# Patient Record
Sex: Female | Born: 1970 | Race: White | Hispanic: No | Marital: Married | Smoking: Never smoker
Health system: Southern US, Community
[De-identification: ages and names within clinical notes are randomized; demographics above are authoritative.]

## PROBLEM LIST (undated history)

## (undated) DIAGNOSIS — K219 Gastro-esophageal reflux disease without esophagitis: Secondary | ICD-10-CM

## (undated) DIAGNOSIS — D649 Anemia, unspecified: Secondary | ICD-10-CM

## (undated) DIAGNOSIS — M25572 Pain in left ankle and joints of left foot: Secondary | ICD-10-CM

## (undated) DIAGNOSIS — Z0000 Encounter for general adult medical examination without abnormal findings: Secondary | ICD-10-CM

## (undated) DIAGNOSIS — D1720 Benign lipomatous neoplasm of skin and subcutaneous tissue of unspecified limb: Principal | ICD-10-CM

## (undated) DIAGNOSIS — G43909 Migraine, unspecified, not intractable, without status migrainosus: Secondary | ICD-10-CM

## (undated) DIAGNOSIS — E669 Obesity, unspecified: Secondary | ICD-10-CM

## (undated) DIAGNOSIS — T7840XA Allergy, unspecified, initial encounter: Secondary | ICD-10-CM

## (undated) DIAGNOSIS — N6019 Diffuse cystic mastopathy of unspecified breast: Secondary | ICD-10-CM

## (undated) HISTORY — PX: ANTERIOR AND POSTERIOR VAGINAL REPAIR: SUR5

## (undated) HISTORY — DX: Benign lipomatous neoplasm of skin and subcutaneous tissue of unspecified limb: D17.20

## (undated) HISTORY — DX: Encounter for general adult medical examination without abnormal findings: Z00.00

## (undated) HISTORY — PX: WISDOM TOOTH EXTRACTION: SHX21

## (undated) HISTORY — DX: Obesity, unspecified: E66.9

## (undated) HISTORY — DX: Allergy, unspecified, initial encounter: T78.40XA

## (undated) HISTORY — DX: Pain in left ankle and joints of left foot: M25.572

## (undated) HISTORY — DX: Gastro-esophageal reflux disease without esophagitis: K21.9

## (undated) HISTORY — DX: Diffuse cystic mastopathy of unspecified breast: N60.19

## (undated) HISTORY — DX: Anemia, unspecified: D64.9

## (undated) HISTORY — DX: Migraine, unspecified, not intractable, without status migrainosus: G43.909

---
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
## 1998-11-02 ENCOUNTER — Ambulatory Visit (HOSPITAL_COMMUNITY): Admission: RE | Admit: 1998-11-02 | Discharge: 1998-11-02 | Payer: Self-pay | Admitting: Obstetrics and Gynecology

## 1999-02-10 ENCOUNTER — Inpatient Hospital Stay (HOSPITAL_COMMUNITY): Admission: AD | Admit: 1999-02-10 | Discharge: 1999-02-13 | Payer: Self-pay | Admitting: Obstetrics and Gynecology

## 1999-02-15 ENCOUNTER — Encounter (HOSPITAL_COMMUNITY): Admission: RE | Admit: 1999-02-15 | Discharge: 1999-05-16 | Payer: Self-pay | Admitting: Obstetrics and Gynecology

## 1999-03-14 ENCOUNTER — Other Ambulatory Visit: Admission: RE | Admit: 1999-03-14 | Discharge: 1999-03-14 | Payer: Self-pay | Admitting: Obstetrics and Gynecology

## 2000-03-21 ENCOUNTER — Other Ambulatory Visit: Admission: RE | Admit: 2000-03-21 | Discharge: 2000-03-21 | Payer: Self-pay | Admitting: Obstetrics and Gynecology

## 2002-03-03 ENCOUNTER — Other Ambulatory Visit: Admission: RE | Admit: 2002-03-03 | Discharge: 2002-03-03 | Payer: Self-pay | Admitting: Obstetrics and Gynecology

## 2003-04-20 ENCOUNTER — Other Ambulatory Visit: Admission: RE | Admit: 2003-04-20 | Discharge: 2003-04-20 | Payer: Self-pay | Admitting: Obstetrics and Gynecology

## 2004-05-04 ENCOUNTER — Other Ambulatory Visit: Admission: RE | Admit: 2004-05-04 | Discharge: 2004-05-04 | Payer: Self-pay | Admitting: Obstetrics and Gynecology

## 2005-05-15 ENCOUNTER — Other Ambulatory Visit: Admission: RE | Admit: 2005-05-15 | Discharge: 2005-05-15 | Payer: Self-pay | Admitting: Obstetrics and Gynecology

## 2009-06-01 ENCOUNTER — Inpatient Hospital Stay (HOSPITAL_COMMUNITY): Admission: RE | Admit: 2009-06-01 | Discharge: 2009-06-03 | Payer: Self-pay | Admitting: Obstetrics and Gynecology

## 2010-12-08 LAB — CBC
MCHC: 34.2 g/dL (ref 30.0–36.0)
Platelets: 132 10*3/uL — ABNORMAL LOW (ref 150–400)
Platelets: 164 10*3/uL (ref 150–400)
RBC: 3.9 MIL/uL (ref 3.87–5.11)
RDW: 14.9 % (ref 11.5–15.5)
WBC: 9.4 10*3/uL (ref 4.0–10.5)

## 2010-12-08 LAB — RH IMMUNE GLOB WKUP(>/=20WKS)(NOT WOMEN'S HOSP): Fetal Screen: NEGATIVE

## 2010-12-08 LAB — RPR: RPR Ser Ql: NONREACTIVE

## 2011-11-21 ENCOUNTER — Ambulatory Visit (INDEPENDENT_AMBULATORY_CARE_PROVIDER_SITE_OTHER): Payer: BC Managed Care – PPO | Admitting: Family Medicine

## 2011-11-21 ENCOUNTER — Encounter: Payer: Self-pay | Admitting: Family Medicine

## 2011-11-21 VITALS — BP 117/75 | HR 66 | Temp 99.2°F | Ht 64.5 in | Wt 187.0 lb

## 2011-11-21 DIAGNOSIS — Z0000 Encounter for general adult medical examination without abnormal findings: Secondary | ICD-10-CM

## 2011-11-21 DIAGNOSIS — D1779 Benign lipomatous neoplasm of other sites: Secondary | ICD-10-CM

## 2011-11-21 DIAGNOSIS — G43909 Migraine, unspecified, not intractable, without status migrainosus: Secondary | ICD-10-CM

## 2011-11-21 DIAGNOSIS — D1720 Benign lipomatous neoplasm of skin and subcutaneous tissue of unspecified limb: Secondary | ICD-10-CM | POA: Insufficient documentation

## 2011-11-21 DIAGNOSIS — E669 Obesity, unspecified: Secondary | ICD-10-CM

## 2011-11-21 DIAGNOSIS — N6019 Diffuse cystic mastopathy of unspecified breast: Secondary | ICD-10-CM | POA: Insufficient documentation

## 2011-11-21 DIAGNOSIS — K219 Gastro-esophageal reflux disease without esophagitis: Secondary | ICD-10-CM

## 2011-11-21 DIAGNOSIS — IMO0001 Reserved for inherently not codable concepts without codable children: Secondary | ICD-10-CM

## 2011-11-21 HISTORY — DX: Reserved for inherently not codable concepts without codable children: IMO0001

## 2011-11-21 HISTORY — DX: Benign lipomatous neoplasm of skin and subcutaneous tissue of unspecified limb: D17.20

## 2011-11-21 HISTORY — DX: Encounter for general adult medical examination without abnormal findings: Z00.00

## 2011-11-21 HISTORY — DX: Migraine, unspecified, not intractable, without status migrainosus: G43.909

## 2011-11-21 NOTE — Patient Instructions (Signed)
Preventive Care for Adults, Female A healthy lifestyle and preventive care can promote health and wellness. Preventive health guidelines for women include the following key practices.  A routine yearly physical is a good way to check with your caregiver about your health and preventive screening. It is a chance to share any concerns and updates on your health, and to receive a thorough exam.   Visit your dentist for a routine exam and preventive care every 6 months. Brush your teeth twice a day and floss once a day. Good oral hygiene prevents tooth decay and gum disease.   The frequency of eye exams is based on your age, health, family medical history, use of contact lenses, and other factors. Follow your caregiver's recommendations for frequency of eye exams.   Eat a healthy diet. Foods like vegetables, fruits, whole grains, low-fat dairy products, and lean protein foods contain the nutrients you need without too many calories. Decrease your intake of foods high in solid fats, added sugars, and salt. Eat the right amount of calories for you.Get information about a proper diet from your caregiver, if necessary.   Regular physical exercise is one of the most important things you can do for your health. Most adults should get at least 150 minutes of moderate-intensity exercise (any activity that increases your heart rate and causes you to sweat) each week. In addition, most adults need muscle-strengthening exercises on 2 or more days a week.   Maintain a healthy weight. The body mass index (BMI) is a screening tool to identify possible weight problems. It provides an estimate of body fat based on height and weight. Your caregiver can help determine your BMI, and can help you achieve or maintain a healthy weight.For adults 20 years and older:   A BMI below 18.5 is considered underweight.   A BMI of 18.5 to 24.9 is normal.   A BMI of 25 to 29.9 is considered overweight.   A BMI of 30 and above is  considered obese.   Maintain normal blood lipids and cholesterol levels by exercising and minimizing your intake of saturated fat. Eat a balanced diet with plenty of fruit and vegetables. Blood tests for lipids and cholesterol should begin at age 20 and be repeated every 5 years. If your lipid or cholesterol levels are high, you are over 50, or you are at high risk for heart disease, you may need your cholesterol levels checked more frequently.Ongoing high lipid and cholesterol levels should be treated with medicines if diet and exercise are not effective.   If you smoke, find out from your caregiver how to quit. If you do not use tobacco, do not start.   If you are pregnant, do not drink alcohol. If you are breastfeeding, be very cautious about drinking alcohol. If you are not pregnant and choose to drink alcohol, do not exceed 1 drink per day. One drink is considered to be 12 ounces (355 mL) of beer, 5 ounces (148 mL) of wine, or 1.5 ounces (44 mL) of liquor.   Avoid use of street drugs. Do not share needles with anyone. Ask for help if you need support or instructions about stopping the use of drugs.   High blood pressure causes heart disease and increases the risk of stroke. Your blood pressure should be checked at least every 1 to 2 years. Ongoing high blood pressure should be treated with medicines if weight loss and exercise are not effective.   If you are 55 to 42   years old, ask your caregiver if you should take aspirin to prevent strokes.   Diabetes screening involves taking a blood sample to check your fasting blood sugar level. This should be done once every 3 years, after age 45, if you are within normal weight and without risk factors for diabetes. Testing should be considered at a younger age or be carried out more frequently if you are overweight and have at least 1 risk factor for diabetes.   Breast cancer screening is essential preventive care for women. You should practice "breast  self-awareness." This means understanding the normal appearance and feel of your breasts and may include breast self-examination. Any changes detected, no matter how small, should be reported to a caregiver. Women in their 20s and 30s should have a clinical breast exam (CBE) by a caregiver as part of a regular health exam every 1 to 3 years. After age 40, women should have a CBE every year. Starting at age 40, women should consider having a mammography (breast X-ray test) every year. Women who have a family history of breast cancer should talk to their caregiver about genetic screening. Women at a high risk of breast cancer should talk to their caregivers about having magnetic resonance imaging (MRI) and a mammography every year.   The Pap test is a screening test for cervical cancer. A Pap test can show cell changes on the cervix that might become cervical cancer if left untreated. A Pap test is a procedure in which cells are obtained and examined from the lower end of the uterus (cervix).   Women should have a Pap test starting at age 21.   Between ages 21 and 29, Pap tests should be repeated every 2 years.   Beginning at age 30, you should have a Pap test every 3 years as long as the past 3 Pap tests have been normal.   Some women have medical problems that increase the chance of getting cervical cancer. Talk to your caregiver about these problems. It is especially important to talk to your caregiver if a new problem develops soon after your last Pap test. In these cases, your caregiver may recommend more frequent screening and Pap tests.   The above recommendations are the same for women who have or have not gotten the vaccine for human papillomavirus (HPV).   If you had a hysterectomy for a problem that was not cancer or a condition that could lead to cancer, then you no longer need Pap tests. Even if you no longer need a Pap test, a regular exam is a good idea to make sure no other problems are  starting.   If you are between ages 65 and 70, and you have had normal Pap tests going back 10 years, you no longer need Pap tests. Even if you no longer need a Pap test, a regular exam is a good idea to make sure no other problems are starting.   If you have had past treatment for cervical cancer or a condition that could lead to cancer, you need Pap tests and screening for cancer for at least 20 years after your treatment.   If Pap tests have been discontinued, risk factors (such as a new sexual partner) need to be reassessed to determine if screening should be resumed.   The HPV test is an additional test that may be used for cervical cancer screening. The HPV test looks for the virus that can cause the cell changes on the cervix.   The cells collected during the Pap test can be tested for HPV. The HPV test could be used to screen women aged 30 years and older, and should be used in women of any age who have unclear Pap test results. After the age of 30, women should have HPV testing at the same frequency as a Pap test.   Colorectal cancer can be detected and often prevented. Most routine colorectal cancer screening begins at the age of 50 and continues through age 75. However, your caregiver may recommend screening at an earlier age if you have risk factors for colon cancer. On a yearly basis, your caregiver may provide home test kits to check for hidden blood in the stool. Use of a small camera at the end of a tube, to directly examine the colon (sigmoidoscopy or colonoscopy), can detect the earliest forms of colorectal cancer. Talk to your caregiver about this at age 50, when routine screening begins. Direct examination of the colon should be repeated every 5 to 10 years through age 75, unless early forms of pre-cancerous polyps or small growths are found.   Hepatitis C blood testing is recommended for all people born from 1945 through 1965 and any individual with known risks for hepatitis C.    Practice safe sex. Use condoms and avoid high-risk sexual practices to reduce the spread of sexually transmitted infections (STIs). STIs include gonorrhea, chlamydia, syphilis, trichomonas, herpes, HPV, and human immunodeficiency virus (HIV). Herpes, HIV, and HPV are viral illnesses that have no cure. They can result in disability, cancer, and death. Sexually active women aged 25 and younger should be checked for chlamydia. Older women with new or multiple partners should also be tested for chlamydia. Testing for other STIs is recommended if you are sexually active and at increased risk.   Osteoporosis is a disease in which the bones lose minerals and strength with aging. This can result in serious bone fractures. The risk of osteoporosis can be identified using a bone density scan. Women ages 65 and over and women at risk for fractures or osteoporosis should discuss screening with their caregivers. Ask your caregiver whether you should take a calcium supplement or vitamin D to reduce the rate of osteoporosis.   Menopause can be associated with physical symptoms and risks. Hormone replacement therapy is available to decrease symptoms and risks. You should talk to your caregiver about whether hormone replacement therapy is right for you.   Use sunscreen with sun protection factor (SPF) of 30 or more. Apply sunscreen liberally and repeatedly throughout the day. You should seek shade when your shadow is shorter than you. Protect yourself by wearing long sleeves, pants, a wide-brimmed hat, and sunglasses year round, whenever you are outdoors.   Once a month, do a whole body skin exam, using a mirror to look at the skin on your back. Notify your caregiver of new moles, moles that have irregular borders, moles that are larger than a pencil eraser, or moles that have changed in shape or color.   Stay current with required immunizations.   Influenza. You need a dose every fall (or winter). The composition of  the flu vaccine changes each year, so being vaccinated once is not enough.   Pneumococcal polysaccharide. You need 1 to 2 doses if you smoke cigarettes or if you have certain chronic medical conditions. You need 1 dose at age 65 (or older) if you have never been vaccinated.   Tetanus, diphtheria, pertussis (Tdap, Td). Get 1 dose of   Tdap vaccine if you are younger than age 65, are over 65 and have contact with an infant, are a healthcare worker, are pregnant, or simply want to be protected from whooping cough. After that, you need a Td booster dose every 10 years. Consult your caregiver if you have not had at least 3 tetanus and diphtheria-containing shots sometime in your life or have a deep or dirty wound.   HPV. You need this vaccine if you are a woman age 26 or younger. The vaccine is given in 3 doses over 6 months.   Measles, mumps, rubella (MMR). You need at least 1 dose of MMR if you were born in 1957 or later. You may also need a second dose.   Meningococcal. If you are age 19 to 21 and a first-year college student living in a residence hall, or have one of several medical conditions, you need to get vaccinated against meningococcal disease. You may also need additional booster doses.   Zoster (shingles). If you are age 60 or older, you should get this vaccine.   Varicella (chickenpox). If you have never had chickenpox or you were vaccinated but received only 1 dose, talk to your caregiver to find out if you need this vaccine.   Hepatitis A. You need this vaccine if you have a specific risk factor for hepatitis A virus infection or you simply wish to be protected from this disease. The vaccine is usually given as 2 doses, 6 to 18 months apart.   Hepatitis B. You need this vaccine if you have a specific risk factor for hepatitis B virus infection or you simply wish to be protected from this disease. The vaccine is given in 3 doses, usually over 6 months.  Preventive Services /  Frequency Ages 19 to 39  Blood pressure check.** / Every 1 to 2 years.   Lipid and cholesterol check.** / Every 5 years beginning at age 20.   Clinical breast exam.** / Every 3 years for women in their 20s and 30s.   Pap test.** / Every 2 years from ages 21 through 29. Every 3 years starting at age 30 through age 65 or 70 with a history of 3 consecutive normal Pap tests.   HPV screening.** / Every 3 years from ages 30 through ages 65 to 70 with a history of 3 consecutive normal Pap tests.   Hepatitis C blood test.** / For any individual with known risks for hepatitis C.   Skin self-exam. / Monthly.   Influenza immunization.** / Every year.   Pneumococcal polysaccharide immunization.** / 1 to 2 doses if you smoke cigarettes or if you have certain chronic medical conditions.   Tetanus, diphtheria, pertussis (Tdap, Td) immunization. / A one-time dose of Tdap vaccine. After that, you need a Td booster dose every 10 years.   HPV immunization. / 3 doses over 6 months, if you are 26 and younger.   Measles, mumps, rubella (MMR) immunization. / You need at least 1 dose of MMR if you were born in 1957 or later. You may also need a second dose.   Meningococcal immunization. / 1 dose if you are age 19 to 21 and a first-year college student living in a residence hall, or have one of several medical conditions, you need to get vaccinated against meningococcal disease. You may also need additional booster doses.   Varicella immunization.** / Consult your caregiver.   Hepatitis A immunization.** / Consult your caregiver. 2 doses, 6 to 18 months   apart.   Hepatitis B immunization.** / Consult your caregiver. 3 doses usually over 6 months.  Ages 40 to 64  Blood pressure check.** / Every 1 to 2 years.   Lipid and cholesterol check.** / Every 5 years beginning at age 20.   Clinical breast exam.** / Every year after age 40.   Mammogram.** / Every year beginning at age 40 and continuing for as  long as you are in good health. Consult with your caregiver.   Pap test.** / Every 3 years starting at age 30 through age 65 or 70 with a history of 3 consecutive normal Pap tests.   HPV screening.** / Every 3 years from ages 30 through ages 65 to 70 with a history of 3 consecutive normal Pap tests.   Fecal occult blood test (FOBT) of stool. / Every year beginning at age 50 and continuing until age 75. You may not need to do this test if you get a colonoscopy every 10 years.   Flexible sigmoidoscopy or colonoscopy.** / Every 5 years for a flexible sigmoidoscopy or every 10 years for a colonoscopy beginning at age 50 and continuing until age 75.   Hepatitis C blood test.** / For all people born from 1945 through 1965 and any individual with known risks for hepatitis C.   Skin self-exam. / Monthly.   Influenza immunization.** / Every year.   Pneumococcal polysaccharide immunization.** / 1 to 2 doses if you smoke cigarettes or if you have certain chronic medical conditions.   Tetanus, diphtheria, pertussis (Tdap, Td) immunization.** / A one-time dose of Tdap vaccine. After that, you need a Td booster dose every 10 years.   Measles, mumps, rubella (MMR) immunization. / You need at least 1 dose of MMR if you were born in 1957 or later. You may also need a second dose.   Varicella immunization.** / Consult your caregiver.   Meningococcal immunization.** / Consult your caregiver.   Hepatitis A immunization.** / Consult your caregiver. 2 doses, 6 to 18 months apart.   Hepatitis B immunization.** / Consult your caregiver. 3 doses, usually over 6 months.  Ages 65 and over  Blood pressure check.** / Every 1 to 2 years.   Lipid and cholesterol check.** / Every 5 years beginning at age 20.   Clinical breast exam.** / Every year after age 40.   Mammogram.** / Every year beginning at age 40 and continuing for as long as you are in good health. Consult with your caregiver.   Pap test.** /  Every 3 years starting at age 30 through age 65 or 70 with a 3 consecutive normal Pap tests. Testing can be stopped between 65 and 70 with 3 consecutive normal Pap tests and no abnormal Pap or HPV tests in the past 10 years.   HPV screening.** / Every 3 years from ages 30 through ages 65 or 70 with a history of 3 consecutive normal Pap tests. Testing can be stopped between 65 and 70 with 3 consecutive normal Pap tests and no abnormal Pap or HPV tests in the past 10 years.   Fecal occult blood test (FOBT) of stool. / Every year beginning at age 50 and continuing until age 75. You may not need to do this test if you get a colonoscopy every 10 years.   Flexible sigmoidoscopy or colonoscopy.** / Every 5 years for a flexible sigmoidoscopy or every 10 years for a colonoscopy beginning at age 50 and continuing until age 75.   Hepatitis   C blood test.** / For all people born from 1945 through 1965 and any individual with known risks for hepatitis C.   Osteoporosis screening.** / A one-time screening for women ages 65 and over and women at risk for fractures or osteoporosis.   Skin self-exam. / Monthly.   Influenza immunization.** / Every year.   Pneumococcal polysaccharide immunization.** / 1 dose at age 65 (or older) if you have never been vaccinated.   Tetanus, diphtheria, pertussis (Tdap, Td) immunization. / A one-time dose of Tdap vaccine if you are over 65 and have contact with an infant, are a healthcare worker, or simply want to be protected from whooping cough. After that, you need a Td booster dose every 10 years.   Varicella immunization.** / Consult your caregiver.   Meningococcal immunization.** / Consult your caregiver.   Hepatitis A immunization.** / Consult your caregiver. 2 doses, 6 to 18 months apart.   Hepatitis B immunization.** / Check with your caregiver. 3 doses, usually over 6 months.  ** Family history and personal history of risk and conditions may change your caregiver's  recommendations. Document Released: 10/16/2001 Document Revised: 08/09/2011 Document Reviewed: 01/15/2011 [employer] Patient Information 2012 [employer]. 

## 2011-11-21 NOTE — Assessment & Plan Note (Signed)
Sees her gyn for paps and labs historically, we will request old records and repeat her labs prior to next years check up

## 2011-11-21 NOTE — Assessment & Plan Note (Signed)
Given a copy of the DASH diet to consider and encouraged to try and exercise more

## 2011-11-21 NOTE — Assessment & Plan Note (Signed)
About 1 cm lesion, firm about mid thigh has been present for several years but the thigh distal to that is increasingly uncomfortable for the past 1-2 months, will check ultrasound and she will report if symptoms worsen

## 2011-11-21 NOTE — Assessment & Plan Note (Signed)
Infrequent and responsive to Zomig and Ibuprofen, may use them together as needed.

## 2011-11-21 NOTE — Progress Notes (Signed)
Patient ID: Kristen Evans, female   DOB: Jan 28, 1971, 41 y.o.   MRN: [id number] Kristen Evans [id number] 1971-06-16 11/21/2011      Progress Note New Patient  Subjective  Chief Complaint  Chief Complaint  Patient presents with  . Establish Care    left thigh pain, known cyst    HPI  Patient is a 41 year old Caucasian female who is in today for new patient appointment. She is generally in good health. She's concerned about a lesion on her left anterior thigh which is actually present for about 2 years. Her concerns over the last several weeks has been a mild achy sensation distal to that lesion. The lesion does not appear to growing. She denies any trauma. It is firm and nontender itself. She sees OB/GYN for her GYN care is. She has a migraines but they're infrequent. She has reflux less than once a week and does take an occasional ranitidine with good effect. No recent illness, fevers, chills, congestion, chest pain, palpitations, shortness of breath, GI or GU complaints.  Past Medical History  Diagnosis Date  . Migraine   . Obesity   . Anemia     with pregnancy  . GERD (gastroesophageal reflux disease)     pregnancy and recently  . Fibrocystic breast disease   . Lipoma of lower extremity 11/21/2011  . Reflux 11/21/2011  . Migraines 11/21/2011  . Preventative health care 11/21/2011    Past Surgical History  Procedure Date  . Breast surgery 1987    biopsy    Family History  Problem Relation Age of Onset  . Cancer Mother     breast  . Hypertension Mother   . Hyperlipidemia Father   . Other Father     polycythemia  vera  . Arthritis Maternal Grandmother   . Cancer Maternal Grandmother     breast  . Hyperlipidemia Maternal Grandmother   . Stroke Maternal Grandmother   . Heart disease Maternal Grandfather   . Hyperlipidemia Maternal Grandfather   . Stroke Maternal Grandfather   . Heart disease Paternal Grandfather   . GER disease Daughter     History   Social  History  . Marital Status: Married    Spouse Name: N/A    Number of Children: N/A  . Years of Education: N/A   Occupational History  . Not on file.   Social History Main Topics  . Smoking status: Never Smoker   . Smokeless tobacco: Never Used  . Alcohol Use: No  . Drug Use: No  . Sexually Active: Yes   Other Topics Concern  . Not on file   Social History Narrative  . No narrative on file    No current outpatient prescriptions on file prior to visit.    Allergies  Allergen Reactions  . Penicillins Hives    Review of Systems  Review of Systems  Constitutional: Negative for fever and malaise/fatigue.  HENT: Negative for congestion.   Eyes: Negative for discharge.  Respiratory: Negative for shortness of breath.   Cardiovascular: Negative for chest pain, palpitations and leg swelling.  Gastrointestinal: Positive for heartburn. Negative for nausea, abdominal pain and diarrhea.  Genitourinary: Negative for dysuria.  Musculoskeletal: Positive for myalgias. Negative for falls.        Thigh, left, nodule and pain  Skin: Negative for rash.  Neurological: Negative for loss of consciousness and headaches.  Endo/Heme/Allergies: Negative for polydipsia.  Psychiatric/Behavioral: Negative for depression and suicidal ideas. The patient is not nervous/anxious and does  not have insomnia.     Objective  BP 117/75  Pulse 66  Temp(Src) 99.2 F (37.3 C) (Temporal)  Ht 5' 4.5" (1.638 m)  Wt 187 lb (84.823 kg)  BMI 31.60 kg/m2  SpO2 97%  LMP 11/03/2011  Physical Exam  Physical Exam  Constitutional: She is oriented to person, place, and time and well-developed, well-nourished, and in no distress. No distress.  HENT:  Head: Normocephalic and atraumatic.  Right Ear: External ear normal.  Left Ear: External ear normal.  Nose: Nose normal.  Mouth/Throat: Oropharynx is clear and moist. No oropharyngeal exudate.  Eyes: Conjunctivae are normal. Pupils are equal, round, and  reactive to light. Right eye exhibits no discharge. Left eye exhibits no discharge. No scleral icterus.  Neck: Normal range of motion. Neck supple. No thyromegaly present.  Cardiovascular: Normal rate, regular rhythm, normal heart sounds and intact distal pulses.   No murmur heard. Pulmonary/Chest: Effort normal and breath sounds normal. No respiratory distress. She has no wheezes. She has no rales.  Abdominal: Soft. Bowel sounds are normal. She exhibits no distension and no mass. There is no tenderness.  Musculoskeletal: Normal range of motion. She exhibits no edema and no tenderness.  Lymphadenopathy:    She has no cervical adenopathy.  Neurological: She is alert and oriented to person, place, and time. She has normal reflexes. No cranial nerve deficit. Coordination normal.  Skin: Skin is warm and dry. No rash noted. She is not diaphoretic.       1 cm firm round nodule mid anterior left thigh  Psychiatric: Mood, memory and affect normal.       Assessment & Plan  Preventative health care Sees her gyn for paps and labs historically, we will request old records and repeat her labs prior to next years check up  Reflux infrequent episodes, encouraged to avoid offending foods and may use ranitidine prn, notify us if symptoms worsen  Migraines Infrequent and responsive to Zomig and Ibuprofen, may use them together as needed.  Lipoma of lower extremity About 1 cm lesion, firm about mid thigh has been present for several years but the thigh distal to that is increasingly uncomfortable for the past 1-2 months, will check ultrasound and she will report if symptoms worsen  Obesity Given a copy of the DASH diet to consider and encouraged to try and exercise more

## 2011-11-21 NOTE — Assessment & Plan Note (Signed)
infrequent episodes, encouraged to avoid offending foods and may use ranitidine prn, notify us if symptoms worsen

## 2011-11-22 ENCOUNTER — Ambulatory Visit (HOSPITAL_BASED_OUTPATIENT_CLINIC_OR_DEPARTMENT_OTHER)
Admission: RE | Admit: 2011-11-22 | Discharge: 2011-11-22 | Disposition: A | Discharge status: Home / Self Care | Payer: BC Managed Care – PPO | Source: Ambulatory Visit | Attending: Family Medicine | Admitting: Family Medicine

## 2011-11-22 ENCOUNTER — Other Ambulatory Visit (HOSPITAL_BASED_OUTPATIENT_CLINIC_OR_DEPARTMENT_OTHER): Payer: Self-pay | Admitting: Family Medicine

## 2011-11-22 ENCOUNTER — Other Ambulatory Visit: Payer: Self-pay | Admitting: Family Medicine

## 2011-11-22 DIAGNOSIS — R229 Localized swelling, mass and lump, unspecified: Secondary | ICD-10-CM | POA: Insufficient documentation

## 2011-11-22 DIAGNOSIS — D1720 Benign lipomatous neoplasm of skin and subcutaneous tissue of unspecified limb: Secondary | ICD-10-CM

## 2011-11-22 DIAGNOSIS — R29898 Other symptoms and signs involving the musculoskeletal system: Secondary | ICD-10-CM

## 2011-11-22 DIAGNOSIS — R9389 Abnormal findings on diagnostic imaging of other specified body structures: Secondary | ICD-10-CM

## 2011-11-27 ENCOUNTER — Other Ambulatory Visit: Payer: Self-pay | Admitting: Family Medicine

## 2011-11-27 DIAGNOSIS — R2240 Localized swelling, mass and lump, unspecified lower limb: Secondary | ICD-10-CM

## 2011-11-29 ENCOUNTER — Telehealth: Payer: Self-pay

## 2011-11-29 NOTE — Telephone Encounter (Signed)
Patient left a message stating she would like a call back from MD. Pt would like to ask a couple of questions about the ultrasound results? Pt did state in the message that a referral to the surgeon has already been done and she has an appt. Please advise?

## 2011-12-03 NOTE — Telephone Encounter (Signed)
Spoke with patient by phone and let her know that her ultrasound results showed multiple nonspecific lesions, tissue is needed to rule out more serious symptoms such as cancer or lupus

## 2011-12-17 ENCOUNTER — Encounter (INDEPENDENT_AMBULATORY_CARE_PROVIDER_SITE_OTHER): Payer: Self-pay | Admitting: General Surgery

## 2011-12-17 ENCOUNTER — Other Ambulatory Visit (INDEPENDENT_AMBULATORY_CARE_PROVIDER_SITE_OTHER): Payer: Self-pay | Admitting: General Surgery

## 2011-12-17 ENCOUNTER — Ambulatory Visit (INDEPENDENT_AMBULATORY_CARE_PROVIDER_SITE_OTHER): Payer: BC Managed Care – PPO | Admitting: General Surgery

## 2011-12-17 VITALS — BP 118/80 | HR 70 | Temp 97.8°F | Resp 18 | Ht 64.5 in | Wt 184.4 lb

## 2011-12-17 DIAGNOSIS — R229 Localized swelling, mass and lump, unspecified: Secondary | ICD-10-CM

## 2011-12-17 DIAGNOSIS — R2240 Localized swelling, mass and lump, unspecified lower limb: Secondary | ICD-10-CM

## 2011-12-17 NOTE — Assessment & Plan Note (Signed)
Given ultrasound findings of multiplicity, would get MR to better characterize lesions.    At that point, if still concerning, would get needle biopsy.  If lesions are malignant, would determine whether I do en bloc resection or shell out mass(es).  Either way, pt is symptomatic, and will likely need resection.  Follow up after MR results and biopsy.

## 2011-12-17 NOTE — Progress Notes (Signed)
Chief Complaint  Patient presents with  . Mass    Leg    HISTORY: The patient is a 41 year old female who has had a 2 cm nodule in her upper thigh for many years. This has never bothered her. However, in the fall she started exercising. She noticed that her distal medial left thigh was hurting when she exercises. She felt like there was a lumpiness to this. She brought this up to Dr. Abner Greenspan who ordered an ultrasound. The ultrasound demonstrated multiple small nodules in this area. The concern was brought up regarding metastatic disease since there were more than one. She has had a skin lesion taken off near this area around 5-10 years ago. She was told this was benign. She has no history of malignancy or cancer. She does have a family history of breast cancer in her mother and grandmother. Her thigh continues to hurt when she exercises. When she rubs it or when she rests, it feels better.  Past Medical History  Diagnosis Date  . Migraine   . Obesity   . Anemia     with pregnancy  . GERD (gastroesophageal reflux disease)     pregnancy and recently  . Fibrocystic breast disease   . Lipoma of lower extremity 11/21/2011  . Reflux 11/21/2011  . Migraines 11/21/2011  . Preventative health care 11/21/2011    Past Surgical History  Procedure Date  . Breast surgery 1987    biopsy    Current Outpatient Prescriptions  Medication Sig Dispense Refill  . Aspirin-Acetaminophen-Caffeine (EXCEDRIN MIGRAINE PO) Take by mouth as needed.      . ranitidine (ZANTAC) 75 MG tablet Take 75 mg by mouth as needed.      . zolmitriptan (ZOMIG) 5 MG tablet Take 5 mg by mouth as needed.         Allergies  Allergen Reactions  . Penicillins Hives    Happened in childhood, may have spread all over the body.     Family History  Problem Relation Age of Onset  . Cancer Mother     breast  . Hypertension Mother   . Hyperlipidemia Father   . Other Father     polycythemia  vera  . Arthritis Maternal  Grandmother   . Cancer Maternal Grandmother     breast  . Hyperlipidemia Maternal Grandmother   . Stroke Maternal Grandmother   . Heart disease Maternal Grandfather   . Hyperlipidemia Maternal Grandfather   . Stroke Maternal Grandfather   . Heart disease Paternal Grandfather   . GER disease Daughter      History   Social History  . Marital Status: Married    Spouse Name: N/A    Number of Children: N/A  . Years of Education: N/A   Social History Main Topics  . Smoking status: Never Smoker   . Smokeless tobacco: Never Used  . Alcohol Use: No  . Drug Use: No  . Sexually Active: Yes   Other Topics Concern  . None   Social History Narrative  . None     REVIEW OF SYSTEMS - PERTINENT POSITIVES ONLY: 12 point review of systems negative other than HPI and PMH.  EXAM: Filed Vitals:   12/17/11 1456  BP: 118/80  Pulse: 70  Temp: 97.8 F (36.6 C)  Resp: 18    Gen:  No acute distress.  Well nourished and well groomed.   Neurological: Alert and oriented to person, place, and time. Coordination normal.  Head: Normocephalic and atraumatic.  Eyes: Conjunctivae are normal. Pupils are equal, round, and reactive to light. No scleral icterus.  Neck: Normal range of motion. Neck supple. No tracheal deviation or thyromegaly present.  Cardiovascular: Normal rate, regular rhythm, normal heart sounds and intact distal pulses.  Exam reveals no gallop and no friction rub.  No murmur heard. Respiratory: Effort normal.  No respiratory distress. No chest wall tenderness. Breath sounds normal.  No wheezes, rales or rhonchi.  GI: Soft. Bowel sounds are normal. The abdomen is soft and nontender.  There is no rebound and no guarding.  Musculoskeletal: Normal range of motion. Extremities are nontender.  Left thigh with 1.5 -2 cm mass in upper thigh (this is one that is unchanged).  Lower medial thigh near knee with 1 cm nodule palpable when pt extends knee.   Lymphadenopathy: No cervical,  preauricular, postauricular or axillary adenopathy is present Skin: Skin is warm and dry. No rash noted. No diaphoresis. No erythema. No pallor. No clubbing, cyanosis, or edema.   Psychiatric: Normal mood and affect. Behavior is normal. Judgment and thought content normal.    LABORATORY RESULTS: None recently.   RADIOLOGY RESULTS: L thigh ultrasound IMPRESSION:  Multiple hyperechoic well-defined hypovascular masses in the  subcutaneous fat of the left thigh. The appearance is nonspecific.  Given the multiplicity, a systemic abnormality or metastatic  disease must be considered. I recommend consideration of biopsy  one of the lesions for diagnosis.     ASSESSMENT AND PLAN: left thigh masses Given ultrasound findings of multiplicity, would get MR to better characterize lesions.    At that point, if still concerning, would get needle biopsy.  If lesions are malignant, would determine whether I do en bloc resection or shell out mass(es).  Either way, pt is symptomatic, and will likely need resection.  Follow up after MR results and biopsy.    Maudry Diego MD Surgical Oncology, General and Endocrine Surgery [facility]    Visit Diagnoses: No diagnosis found.  Primary Care Physician: Danise Edge, MD, MD

## 2011-12-25 ENCOUNTER — Ambulatory Visit (HOSPITAL_COMMUNITY)
Admission: RE | Admit: 2011-12-25 | Discharge: 2011-12-25 | Disposition: A | Discharge status: Home / Self Care | Payer: BC Managed Care – PPO | Source: Ambulatory Visit | Attending: General Surgery | Admitting: General Surgery

## 2011-12-25 DIAGNOSIS — R229 Localized swelling, mass and lump, unspecified: Secondary | ICD-10-CM | POA: Insufficient documentation

## 2011-12-25 DIAGNOSIS — R2240 Localized swelling, mass and lump, unspecified lower limb: Secondary | ICD-10-CM

## 2011-12-25 MED ORDER — GADOBENATE DIMEGLUMINE 529 MG/ML IV SOLN
20.0000 mL | Freq: Once | INTRAVENOUS | Status: AC | PRN
  Administered 2011-12-25: 18 mL via INTRAVENOUS

## 2012-01-01 ENCOUNTER — Telehealth (INDEPENDENT_AMBULATORY_CARE_PROVIDER_SITE_OTHER): Payer: Self-pay | Admitting: General Surgery

## 2012-01-01 NOTE — Telephone Encounter (Signed)
Called pt and gave her MRI results.  Kristen Evans wanted to know if Dr. Donell Beers felt the need for any biopsies of these areas.  I told her I would find out and call her back.

## 2012-01-02 NOTE — Telephone Encounter (Signed)
Muscle looks good, no evidence of muscle mass.  Look like lipomas.  Don't have to get biopsy, but can remove painful ones if she would like.

## 2014-06-02 ENCOUNTER — Encounter: Payer: Self-pay | Admitting: Physician Assistant

## 2014-06-02 ENCOUNTER — Ambulatory Visit (INDEPENDENT_AMBULATORY_CARE_PROVIDER_SITE_OTHER): Payer: BC Managed Care – PPO | Admitting: Physician Assistant

## 2014-06-02 VITALS — BP 128/69 | HR 77 | Temp 98.3°F | Resp 16 | Ht 64.5 in | Wt 184.0 lb

## 2014-06-02 DIAGNOSIS — M25569 Pain in unspecified knee: Secondary | ICD-10-CM

## 2014-06-02 DIAGNOSIS — M222X1 Patellofemoral disorders, right knee: Secondary | ICD-10-CM

## 2014-06-02 DIAGNOSIS — M222X9 Patellofemoral disorders, unspecified knee: Secondary | ICD-10-CM | POA: Insufficient documentation

## 2014-06-02 MED ORDER — MELOXICAM 15 MG PO TABS: 15.0000 mg | ORAL_TABLET | Freq: Every day | ORAL | Status: DC

## 2014-06-02 NOTE — Progress Notes (Signed)
Pre visit review using our clinic review tool, if applicable. No additional management support is needed unless otherwise documented below in the visit note/SLS  

## 2014-06-02 NOTE — Progress Notes (Signed)
Patient presents to clinic today c/o pain in left knee since last Monday. Endorses some mild soreness initially until around Thursday when the symptoms worsened after going jogging.  Denies numbness or tingling.  Denies swelling or decreased ROM.  Denies trauma or injury.  Has not taken anything for symptoms.  Past Medical History  Diagnosis Date  . Migraine   . Obesity   . Anemia     with pregnancy  . GERD (gastroesophageal reflux disease)     pregnancy and recently  . Fibrocystic breast disease   . Lipoma of lower extremity 11/21/2011  . Reflux 11/21/2011  . Migraines 11/21/2011  . Preventative health care 11/21/2011    Current Outpatient Prescriptions on File Prior to Visit  Medication Sig Dispense Refill  . Aspirin-Acetaminophen-Caffeine (EXCEDRIN MIGRAINE PO) Take by mouth as needed.      . ranitidine (ZANTAC) 75 MG tablet Take 75 mg by mouth as needed.      . zolmitriptan (ZOMIG) 5 MG tablet Take 5 mg by mouth as needed.       No current facility-administered medications on file prior to visit.    Allergies  Allergen Reactions  . Penicillins Hives    Happened in childhood, may have spread all over the body.    Family History  Problem Relation Age of Onset  . Cancer Mother     breast  . Hypertension Mother   . Hyperlipidemia Father   . Other Father     polycythemia  vera  . Arthritis Maternal Grandmother   . Cancer Maternal Grandmother     breast  . Hyperlipidemia Maternal Grandmother   . Stroke Maternal Grandmother   . Heart disease Maternal Grandfather   . Hyperlipidemia Maternal Grandfather   . Stroke Maternal Grandfather   . Heart disease Paternal Grandfather   . GER disease Daughter     History   Social History  . Marital Status: Married    Spouse Name: N/A    Number of Children: N/A  . Years of Education: N/A   Social History Main Topics  . Smoking status: Never Smoker   . Smokeless tobacco: Never Used  . Alcohol Use: No  . Drug Use: No  .  Sexual Activity: Yes   Other Topics Concern  . None   Social History Narrative  . None   Review of Systems - See HPI.  All other ROS are negative.  BP 128/69  Pulse 77  Temp(Src) 98.3 F (36.8 C) (Oral)  Resp 16  Ht 5' 4.5" (1.638 m)  Wt 184 lb (83.462 kg)  BMI 31.11 kg/m2  SpO2 100%  LMP 05/19/2014  Physical Exam  Vitals reviewed. Constitutional: She is oriented to person, place, and time and well-developed, well-nourished, and in no distress.  HENT:  Head: Normocephalic and atraumatic.  Cardiovascular: Normal rate, regular rhythm, normal heart sounds and intact distal pulses.   Pulmonary/Chest: Effort normal and breath sounds normal.  Musculoskeletal:       Right knee: She exhibits normal range of motion, no swelling, normal alignment, no LCL laxity, normal patellar mobility, normal meniscus and no MCL laxity. Tenderness found.       Left knee: Normal.  Neurological: She is alert and oriented to person, place, and time.  Skin: Skin is warm and dry. No rash noted.   Assessment/Plan: Patellofemoral pain syndrome Rx Mobic daily. Encouraged Knee sling with open patella.  Wear daily. Rest, Ice and Elevation. Follow-up if symptoms are not improving.

## 2014-06-02 NOTE — Assessment & Plan Note (Signed)
Rx Mobic daily. Encouraged Knee sling with open patella.  Wear daily. Rest, Ice and Elevation. Follow-up if symptoms are not improving.

## 2014-06-02 NOTE — Patient Instructions (Signed)
Please wear knee brace during the day.  Take mobic once daily.  Use tylenol for breakthrough pain.  Rest.  Elevate your leg while resting.  Avoid overexertion.  Call or return to clinic if symptoms are not improving over the next week.  Patellofemoral Syndrome If you have had pain in the front of your knee for a long time, chances are good that you have patellofemoral syndrome. The word patella refers to the kneecap. Femoral (or femur) refers to the thigh bone. That is the bone the kneecap sits on. The kneecap is shaped like a triangle. Its job is to protect the knee and to improve the efficiency of your thigh muscles (quadriceps). The underside of the kneecap is made of smooth tissue (cartilage). This lets the kneecap slide up and down as the knee moves. Sometimes this cartilage becomes soft. Your healthcare provider may say the cartilage breaks down. That is patellofemoral syndrome. It can affect one knee, or both. The condition is sometimes called patellofemoral pain syndrome. That is because the condition is painful. The pain usually gets worse with activity. Sitting for a long time with the knee bent also makes the pain worse. It usually gets better with rest and proper treatment. CAUSES  No one is sure why some people develop this problem and others do not. Runners often get it. One name for the condition is "runner's knee." However, some people run for years and never have knee pain. Certain things seem to make patellofemoral syndrome more likely. They include:  Moving out of alignment. The kneecap is supposed to move in a straight line when the thigh muscle pulls on it. Sometimes the kneecap moves in poor alignment. That can make the knee swell and hurt. Some experts believe it also wears down the cartilage.  Injury to the kneecap.  Strain on the knee. This may occur during sports activity. Soccer, running, skiing and cycling can put excess stress on the knee.  Being flat-footed or  knock-kneed. SYMPTOMS   Knee pain.  Pain under the kneecap. This is usually a dull, aching pain.  Pain in the knee when doing certain things: squatting, kneeling, going up or down stairs.  Pain in the knee when you stand up after sitting down for awhile.  Tightness in the knee.  Loss of muscle strength in the thigh.  Swelling of the knee. DIAGNOSIS  Healthcare providers often send people with knee pain to an orthopedic caregiver. This person has special training to treat problems with bones and joints. To decide what is causing your knee pain, your caregiver will probably:  Do a physical exam. This will probably include:  Asking about symptoms you have noticed.  Asking about your activities and any injuries.  Feeling your knee. Moving it. This will help test the knee's strength. It will also check alignment (whether the knee and leg are aligned normally).  Order some tests, such as:  Imaging tests. They create pictures of the inside of the knee. Tests may include:  X-rays.  Computed tomography (CT) scan. This uses X-rays and a computer to show more detail.  Magnetic resonance imaging (MRI). This test uses magnets, radio waves and a computer to make pictures. TREATMENT   Medication is almost always used first. It can relieve pain. It also can reduce swelling. Non-steroidal anti-inflammatory medicines (called NSAIDs) are usually suggested. Sometimes a stronger form is needed. A stronger form would require a prescription.  Other treatment may be needed after the swelling goes down. Possibilities include:  Exercise. Certain exercises can make the muscles around the knee stronger which decreases the pressure on the knee cap. This includes the thigh muscle. Certain exercises also may be suggested to increase your flexibility.  A knee brace. This gives the knee extra support and helps align the movement of the knee cap.  Orthotics. These are special shoe inserts. They can help  keep your leg and knee aligned.  Surgery is sometimes needed. This is rare. Options include:  Arthroscopy. The surgeon uses a special tool to remove any damaged pieces of the kneecap. Only a few small incisions (cuts) are needed.  Realignment. This is open surgery. The goals are to reduce pressure and fix the way the kneecap moves. HOME CARE INSTRUCTIONS   Take any medication prescribed by your healthcare provider. Follow the directions carefully.  If your knee is swollen:  Put ice or cold packs on it. Do this for 20 to 30 minutes, 3 to 4 times a day.  Keep the knee raised. Make sure it is supported. Put a pillow under it.  Rest your knee. For example, take the elevator instead of the stairs for awhile. Or, take a break from sports activity that strain your knee. Try walking or swimming instead.  Whenever you are active:  Use an elastic bandage on your knee. This gives it support.  After any activity, put ice or cold packs on your knees. Do this for about 10 to 20 minutes.  Make sure you wear shoes that give good support. Make sure they are not worn down. The heels should not slant in or out. SEEK MEDICAL CARE IF:   Knee pain gets worse. Or it does not go away, even after taking pain medicine.  Swelling does not go down.  Your thigh muscle becomes weak.  You have an oral temperature above 102 F (38.9 C). SEEK IMMEDIATE MEDICAL CARE IF:  You have an oral temperature above 102 F (38.9 C), not controlled by medicine. Document Released: 08/08/2009 Document Revised: 11/12/2011 Document Reviewed: 11/09/2013 [facility] Patient Information 2015 [place], [place]. This information is not intended to replace advice given to you by your health care provider. Make sure you discuss any questions you have with your health care provider.

## 2015-04-14 ENCOUNTER — Other Ambulatory Visit: Payer: Self-pay

## 2015-04-14 DIAGNOSIS — Z1231 Encounter for screening mammogram for malignant neoplasm of breast: Secondary | ICD-10-CM

## 2015-05-27 ENCOUNTER — Ambulatory Visit
Admission: RE | Admit: 2015-05-27 | Discharge: 2015-05-27 | Disposition: A | Discharge status: Home / Self Care | Payer: BLUE CROSS/BLUE SHIELD | Source: Ambulatory Visit

## 2015-05-27 DIAGNOSIS — Z1231 Encounter for screening mammogram for malignant neoplasm of breast: Secondary | ICD-10-CM

## 2016-06-20 DIAGNOSIS — G43909 Migraine, unspecified, not intractable, without status migrainosus: Secondary | ICD-10-CM | POA: Insufficient documentation

## 2017-03-04 ENCOUNTER — Ambulatory Visit (INDEPENDENT_AMBULATORY_CARE_PROVIDER_SITE_OTHER): Payer: Self-pay | Admitting: Family Medicine

## 2017-03-04 ENCOUNTER — Encounter: Payer: Self-pay | Admitting: Family Medicine

## 2017-03-04 DIAGNOSIS — R2240 Localized swelling, mass and lump, unspecified lower limb: Secondary | ICD-10-CM

## 2017-03-04 DIAGNOSIS — M25572 Pain in left ankle and joints of left foot: Secondary | ICD-10-CM

## 2017-03-04 DIAGNOSIS — R233 Spontaneous ecchymoses: Secondary | ICD-10-CM | POA: Insufficient documentation

## 2017-03-04 DIAGNOSIS — G8929 Other chronic pain: Secondary | ICD-10-CM

## 2017-03-04 DIAGNOSIS — R238 Other skin changes: Secondary | ICD-10-CM

## 2017-03-04 HISTORY — DX: Pain in left ankle and joints of left foot: M25.572

## 2017-03-04 NOTE — Assessment & Plan Note (Signed)
Lipoma still present nontender. She feels it may be slightly larger but no warmth or pain. She will report changes and let us know if she is interested in pursuing a work up.

## 2017-03-04 NOTE — Assessment & Plan Note (Signed)
With some swelling, elevate feet above heart for 25 minutes 3 x a day, encouraged to elevate and wrap can use topical treatment for the pain such as Lidocaine. Encouraged daily exercises. Report worsening

## 2017-03-04 NOTE — Patient Instructions (Signed)
merrel's and clark's sandel Ankle Pain Many things can cause ankle pain, including an injury to the area and overuse of the ankle.The ankle joint holds your body weight and allows you to move around. Ankle pain can occur on either side or the back of one ankle or both ankles. Ankle pain may be sharp and burning or dull and aching. There may be tenderness, stiffness, redness, or warmth around the ankle. Follow these instructions at home: Activity  Rest your ankle as told by your health care provider. Avoid any activities that cause ankle pain.  Do exercises as told by your health care provider.  Ask your health care provider if you can drive. Using a brace, a bandage, or crutches  If you were given a brace: ? Wear it as told by your health care provider. ? Remove it when you take a bath or a shower. ? Try not to move your ankle very much, but wiggle your toes from time to time. This helps to prevent swelling.  If you were given an elastic bandage: ? Remove it when you take a bath or a shower. ? Try not to move your ankle very much, but wiggle your toes from time to time. This helps to prevent swelling. ? Adjust the bandage to make it more comfortable if it feels too tight. ? Loosen the bandage if you have numbness or tingling in your foot or if your foot turns cold and blue.  If you have crutches, use them as told by your health care provider. Continue to use them until you can walk without feeling pain in your ankle. Managing pain, stiffness, and swelling  Raise (elevate) your ankle above the level of your heart while you are sitting or lying down.  If directed, apply ice to the area: ? Put ice in a plastic bag. ? Place a towel between your skin and the bag. ? Leave the ice on for 20 minutes, 2-3 times per day. General instructions  Keep all follow-up visits as told by your health care provider. This is important.  Record this information that may be helpful for you and your  health care provider: ? How often you have ankle pain. ? Where the pain is located. ? What the pain feels like.  Take over-the-counter and prescription medicines only as told by your health care provider. Contact a health care provider if:  Your pain gets worse.  Your pain is not relieved with medicines.  You have a fever or chills.  You are having more trouble with walking.  You have new symptoms. Get help right away if:  Your foot, leg, toes, or ankle tingles or becomes numb.  Your foot, leg, toes, or ankle becomes swollen.  Your foot, leg, toes, or ankle turns pale or blue. This information is not intended to replace advice given to you by your health care provider. Make sure you discuss any questions you have with your health care provider. Document Released: 02/07/2010 Document Revised: 04/20/2016 Document Reviewed: 03/22/2015 [employer] Interactive Patient Education  2017 [address] [address] Felicie Morn orthotics aspercreme cream then a Lidocaine from [employer] or aspercreme Advil/Motrin/Ibuprofen 400 mg up to 4 x a day. twice daily Alternate with Tylenol/Acetaminophen  ES 500 mg tabs, 2 tabs twice daily  ALPHABET

## 2017-03-04 NOTE — Progress Notes (Signed)
Subjective:  I acted as a Education administrator for Dr. Charlett Blake. [place], [place]  Patient ID: Kristen Evans, female    DOB: 1971-09-02, 46 y.o.   MRN: [id number]  No chief complaint on file.   HPI  Patient is in today for an acute visit. She c/o left leg pian and edema. She has been having trouble for about 3 months but denies any trauma or injury. She is concerned about some increased bruising recently has a bruise on her left foot after a long walk this weekend. No bloody or tarry stool. She still has the lipoma on her anterior left thigh. Denies CP/palp/SOB/HA/congestion/fevers/GI or GU c/o. Taking meds as prescribed  Patient Care Team: Mosie Lukes, MD as PCP - General (Family Medicine)   Past Medical History:  Diagnosis Date  . Anemia    with pregnancy  . Fibrocystic breast disease   . GERD (gastroesophageal reflux disease)    pregnancy and recently  . Left ankle pain 03/04/2017  . Lipoma of lower extremity 11/21/2011  . Migraine   . Migraines 11/21/2011  . Obesity   . Preventative health care 11/21/2011  . Reflux 11/21/2011    Past Surgical History:  Procedure Laterality Date  . BREAST SURGERY  1987   biopsy    Family History  Problem Relation Age of Onset  . Cancer Mother        breast  . Hypertension Mother   . Hyperlipidemia Father   . Other Father        polycythemia  vera  . Arthritis Maternal Grandmother   . Cancer Maternal Grandmother        breast  . Hyperlipidemia Maternal Grandmother   . Stroke Maternal Grandmother   . Heart disease Maternal Grandfather   . Hyperlipidemia Maternal Grandfather   . Stroke Maternal Grandfather   . Heart disease Paternal Grandfather   . GER disease Daughter     Social History   Social History  . Marital status: Married    Spouse name: N/A  . Number of children: N/A  . Years of education: N/A   Occupational History  . Not on file.   Social History Main Topics  . Smoking status: Never Smoker  . Smokeless tobacco: Never  Used  . Alcohol use No  . Drug use: No  . Sexual activity: Yes   Other Topics Concern  . Not on file   Social History Narrative  . No narrative on file    Outpatient Medications Prior to Visit  Medication Sig Dispense Refill  . Aspirin-Acetaminophen-Caffeine (EXCEDRIN MIGRAINE PO) Take by mouth as needed.    . ranitidine (ZANTAC) 75 MG tablet Take 75 mg by mouth as needed.    . zolmitriptan (ZOMIG) 5 MG tablet Take 5 mg by mouth as needed.    . meloxicam (MOBIC) 15 MG tablet Take 1 tablet (15 mg total) by mouth daily. 20 tablet 0   No facility-administered medications prior to visit.     Allergies  Allergen Reactions  . Penicillins Hives    Happened in childhood, may have spread all over the body.    Review of Systems  Constitutional: Negative for fever and malaise/fatigue.  HENT: Negative for congestion.   Eyes: Negative for blurred vision.  Respiratory: Negative for shortness of breath.   Cardiovascular: Positive for leg swelling. Negative for chest pain and palpitations.  Gastrointestinal: Negative for abdominal pain, blood in stool and nausea.  Genitourinary: Negative for dysuria and frequency.  Musculoskeletal: Positive  for joint pain and myalgias. Negative for falls.  Skin: Negative for rash.  Neurological: Negative for dizziness, loss of consciousness and headaches.  Endo/Heme/Allergies: Negative for environmental allergies.  Psychiatric/Behavioral: Negative for depression. The patient is not nervous/anxious.        Objective:    Physical Exam  Constitutional: She is oriented to person, place, and time. She appears well-developed and well-nourished. No distress.  HENT:  Head: Normocephalic and atraumatic.  Nose: Nose normal.  Eyes: Right eye exhibits no discharge. Left eye exhibits no discharge.  Neck: Normal range of motion. Neck supple.  Cardiovascular: Normal rate and regular rhythm.   No murmur heard. Pulmonary/Chest: Effort normal and breath sounds  normal.  Abdominal: Soft. Bowel sounds are normal. There is no tenderness.  Musculoskeletal: She exhibits no edema.  1 cm firm circumscribed lesion anterior left thigh. Nontender. Trace to 1 + pedal edema left ankle. Bruising of skin at base of first and second toe of right foot.   Neurological: She is alert and oriented to person, place, and time.  Skin: Skin is warm and dry.  Psychiatric: She has a normal mood and affect.  Nursing note and vitals reviewed.   There were no vitals taken for this visit. Wt Readings from Last 3 Encounters:  06/02/14 184 lb (83.5 kg)  12/17/11 184 lb 6 oz (83.6 kg)  11/21/11 187 lb (84.8 kg)   BP Readings from Last 3 Encounters:  06/02/14 128/69  12/17/11 118/80  11/21/11 117/75      There is no immunization history on file for this patient.  Health Maintenance  Topic Date Due  . HIV Screening  10/26/1984  . TETANUS/TDAP  10/26/1988  . PAP SMEAR  04/22/2014  . INFLUENZA VACCINE  04/03/2017    Lab Results  Component Value Date   WBC 9.4 06/02/2009   HGB 10.0 DELTA CHECK NOTED (L) 06/02/2009   HCT 29.2 (L) 06/02/2009   PLT 132 (L) 06/02/2009   CREATININE 0.76 12/17/2011   BUN 12 12/17/2011    No results found for: TSH Lab Results  Component Value Date   WBC 9.4 06/02/2009   HGB 10.0 DELTA CHECK NOTED (L) 06/02/2009   HCT 29.2 (L) 06/02/2009   MCV 93.0 06/02/2009   PLT 132 (L) 06/02/2009   Lab Results  Component Value Date   BUN 12 12/17/2011   CREATININE 0.76 12/17/2011   No results found for: CHOL No results found for: HDL No results found for: LDLCALC No results found for: TRIG No results found for: CHOLHDL No results found for: HGBA1C       Assessment & Plan:   Problem List Items Addressed This Visit    left thigh masses    Lipoma still present nontender. She feels it may be slightly larger but no warmth or pain. She will report changes and let us know if she is interested in pursuing a work up.       Easy  bruising - Primary    Check cbc and cmp      Relevant Orders   CBC   Comprehensive metabolic panel   Left ankle pain    With some swelling, elevate feet above heart for 25 minutes 3 x a day, encouraged to elevate and wrap can use topical treatment for the pain such as Lidocaine. Encouraged daily exercises. Report worsening         I have discontinued Ms. Marcotte's meloxicam. I am also having her maintain her zolmitriptan, ranitidine, and Aspirin-Acetaminophen-Caffeine (  EXCEDRIN MIGRAINE PO).  No orders of the defined types were placed in this encounter.   CMA served as Education administrator during this visit. History, Physical and Plan performed by medical provider. Documentation and orders reviewed and attested to.  Penni Homans, MD

## 2017-03-04 NOTE — Assessment & Plan Note (Signed)
Check cbc and cmp 

## 2017-03-05 LAB — CBC
HCT: 41.8 % (ref 36.0–46.0)
Hemoglobin: 14.1 g/dL (ref 12.0–15.0)
MCHC: 33.7 g/dL (ref 30.0–36.0)
MCV: 89.1 fl (ref 78.0–100.0)
Platelets: 258 10*3/uL (ref 150.0–400.0)
RBC: 4.69 Mil/uL (ref 3.87–5.11)
RDW: 13.9 % (ref 11.5–15.5)
WBC: 10.1 10*3/uL (ref 4.0–10.5)

## 2017-03-05 LAB — COMPREHENSIVE METABOLIC PANEL
ALK PHOS: 52 U/L (ref 39–117)
ALT: 18 U/L (ref 0–35)
AST: 15 U/L (ref 0–37)
Albumin: 4.3 g/dL (ref 3.5–5.2)
BILIRUBIN TOTAL: 0.3 mg/dL (ref 0.2–1.2)
BUN: 12 mg/dL (ref 6–23)
CO2: 29 mEq/L (ref 19–32)
CREATININE: 0.85 mg/dL (ref 0.40–1.20)
Calcium: 9.9 mg/dL (ref 8.4–10.5)
Chloride: 104 mEq/L (ref 96–112)
GFR: 76.08 mL/min (ref >60.00)
GLUCOSE: 98 mg/dL (ref 70–99)
Potassium: 4.1 mEq/L (ref 3.5–5.1)
SODIUM: 139 meq/L (ref 135–145)
TOTAL PROTEIN: 7.3 g/dL (ref 6.0–8.3)

## 2017-09-09 LAB — HM PAP SMEAR

## 2019-02-19 LAB — HM MAMMOGRAPHY

## 2019-03-26 ENCOUNTER — Encounter: Payer: Self-pay | Admitting: Family Medicine

## 2019-12-08 ENCOUNTER — Encounter: Payer: Self-pay | Admitting: Family Medicine

## 2019-12-08 ENCOUNTER — Other Ambulatory Visit (HOSPITAL_COMMUNITY)
Admission: RE | Admit: 2019-12-08 | Discharge: 2019-12-08 | Disposition: A | Discharge status: Home / Self Care | Payer: Self-pay | Source: Ambulatory Visit | Attending: Family Medicine | Admitting: Family Medicine

## 2019-12-08 ENCOUNTER — Other Ambulatory Visit: Payer: Self-pay

## 2019-12-08 ENCOUNTER — Ambulatory Visit (INDEPENDENT_AMBULATORY_CARE_PROVIDER_SITE_OTHER): Payer: Self-pay | Admitting: Family Medicine

## 2019-12-08 VITALS — BP 122/70 | HR 81 | Temp 97.0°F | Resp 18 | Ht 64.5 in | Wt 202.6 lb

## 2019-12-08 DIAGNOSIS — R350 Frequency of micturition: Secondary | ICD-10-CM | POA: Insufficient documentation

## 2019-12-08 DIAGNOSIS — N898 Other specified noninflammatory disorders of vagina: Secondary | ICD-10-CM

## 2019-12-08 LAB — POC URINALSYSI DIPSTICK (AUTOMATED)
Bilirubin, UA: NEGATIVE
Blood, UA: NEGATIVE
Glucose, UA: NEGATIVE
Ketones, UA: NEGATIVE
Leukocytes, UA: NEGATIVE
Nitrite, UA: NEGATIVE
Protein, UA: POSITIVE — AB
Spec Grav, UA: 1.025 (ref 1.010–1.025)
Urobilinogen, UA: 0.2 E.U./dL
pH, UA: 6 (ref 5.0–8.0)

## 2019-12-08 NOTE — Patient Instructions (Signed)

## 2019-12-08 NOTE — Progress Notes (Signed)
Patient ID: Kristen Evans, female    DOB: Apr 06, 1971  Age: 49 y.o. MRN: [id number]    Subjective:  Subjective  HPI Kristen Evans presents for 1 week hx urine odor , frequency and burning   Review of Systems  Constitutional: Negative for appetite change, diaphoresis, fatigue and unexpected weight change.  Eyes: Negative for pain, redness and visual disturbance.  Respiratory: Negative for cough, chest tightness, shortness of breath and wheezing.   Cardiovascular: Negative for chest pain, palpitations and leg swelling.  Endocrine: Negative for cold intolerance, heat intolerance, polydipsia, polyphagia and polyuria.  Genitourinary: Positive for dysuria, flank pain and frequency. Negative for decreased urine volume, difficulty urinating, vaginal bleeding, vaginal discharge and vaginal pain.  Neurological: Negative for dizziness, light-headedness, numbness and headaches.    History Past Medical History:  Diagnosis Date  . Anemia    with pregnancy  . Fibrocystic breast disease   . GERD (gastroesophageal reflux disease)    pregnancy and recently  . Left ankle pain 03/04/2017  . Lipoma of lower extremity 11/21/2011  . Migraine   . Migraines 11/21/2011  . Obesity   . Preventative health care 11/21/2011  . Reflux 11/21/2011    She has a past surgical history that includes Breast surgery (1987).   Her family history includes Arthritis in her maternal grandmother; Cancer in her maternal grandmother and mother; GER disease in her daughter; Heart disease in her maternal grandfather and paternal grandfather; Hyperlipidemia in her father, maternal grandfather, and maternal grandmother; Hypertension in her mother; Other in her father; Stroke in her maternal grandfather and maternal grandmother.She reports that she has never smoked. She has never used smokeless tobacco. She reports that she does not drink alcohol or use drugs.  Current Outpatient Medications on File Prior to Visit  Medication Sig  Dispense Refill  . Aspirin-Acetaminophen-Caffeine (EXCEDRIN MIGRAINE PO) Take by mouth as needed.    . zolmitriptan (ZOMIG) 5 MG tablet Take 5 mg by mouth as needed.     No current facility-administered medications on file prior to visit.     Objective:  Objective  Physical Exam Vitals and nursing note reviewed.  Constitutional:      Appearance: She is well-developed.  HENT:     Head: Normocephalic and atraumatic.  Eyes:     Conjunctiva/sclera: Conjunctivae normal.  Neck:     Thyroid: No thyromegaly.     Vascular: No carotid bruit or JVD.  Cardiovascular:     Rate and Rhythm: Normal rate and regular rhythm.     Heart sounds: Normal heart sounds. No murmur.  Pulmonary:     Effort: Pulmonary effort is normal. No respiratory distress.     Breath sounds: Normal breath sounds. No wheezing or rales.  Chest:     Chest wall: No tenderness.  Abdominal:     General: Abdomen is flat. There is no distension.     Palpations: Abdomen is soft. There is no mass.     Tenderness: There is no abdominal tenderness. There is no right CVA tenderness, left CVA tenderness, guarding or rebound.     Hernia: No hernia is present.  Genitourinary:    Vagina: No vaginal discharge.  Musculoskeletal:     Cervical back: Normal range of motion and neck supple.  Neurological:     Mental Status: She is alert and oriented to person, place, and time.    BP 122/70 (BP Location: Right Arm, Patient Position: Sitting, Cuff Size: Normal)   Pulse 81   Temp Marland Kitchen)  97 F (36.1 C) (Temporal)   Resp 18   Ht 5' 4.5" (1.638 m)   Wt 202 lb 9.6 oz (91.9 kg)   SpO2 98%   BMI 34.24 kg/m  Wt Readings from Last 3 Encounters:  12/08/19 202 lb 9.6 oz (91.9 kg)  06/02/14 184 lb (83.5 kg)  12/17/11 184 lb 6 oz (83.6 kg)     Lab Results  Component Value Date   WBC 10.1 03/04/2017   HGB 14.1 03/04/2017   HCT 41.8 03/04/2017   PLT 258.0 03/04/2017   GLUCOSE 98 03/04/2017   ALT 18 03/04/2017   AST 15 03/04/2017   NA  139 03/04/2017   K 4.1 03/04/2017   CL 104 03/04/2017   CREATININE 0.85 03/04/2017   BUN 12 03/04/2017   CO2 29 03/04/2017    MM SCREENING BREAST TOMO BILATERAL  Result Date: 05/27/2015 CLINICAL DATA:  Screening. History of prior benign right breast excisional biopsy. EXAM: DIGITAL SCREENING BILATERAL MAMMOGRAM WITH 3D TOMO WITH CAD COMPARISON:  Previous exam(s). ACR Breast Density Category c: The breast tissue is heterogeneously dense, which may obscure small masses. FINDINGS: There are no findings suspicious for malignancy. Images were processed with CAD. IMPRESSION: No mammographic evidence of malignancy. A result letter of this screening mammogram will be mailed directly to the patient. RECOMMENDATION: Screening mammogram in one year. (Code:SM-B-01Y) BI-RADS CATEGORY  1: Negative. Electronically Signed   By: Fidela Salisbury M.D.   On: 05/30/2015 08:11     Assessment & Plan:  Plan  I have discontinued Anderson Malta B. Pilkington's ranitidine. I am also having her maintain her zolmitriptan and Aspirin-Acetaminophen-Caffeine (EXCEDRIN MIGRAINE PO).  No orders of the defined types were placed in this encounter.   Problem List Items Addressed This Visit    None    Visit Diagnoses    Urinary frequency    -  Primary   Relevant Orders   POCT Urinalysis Dipstick (Automated) (Completed)   Urine Culture   Urine cytology ancillary only([facility])   Vaginal odor       Relevant Orders   Urine cytology ancillary only([facility])      Follow-up: Return if symptoms worsen or fail to improve.  Ann Held, DO

## 2019-12-10 ENCOUNTER — Other Ambulatory Visit: Payer: Self-pay | Admitting: Family Medicine

## 2019-12-10 DIAGNOSIS — N390 Urinary tract infection, site not specified: Secondary | ICD-10-CM

## 2019-12-10 LAB — URINE CULTURE
MICRO NUMBER:: 10331980
SPECIMEN QUALITY:: ADEQUATE

## 2019-12-10 MED ORDER — NITROFURANTOIN MONOHYD MACRO 100 MG PO CAPS: 100.0000 mg | ORAL_CAPSULE | Freq: Two times a day (BID) | ORAL | 0 refills | Status: DC

## 2019-12-11 ENCOUNTER — Other Ambulatory Visit: Payer: Self-pay

## 2019-12-11 LAB — URINE CYTOLOGY ANCILLARY ONLY
Bacterial Vaginitis-Urine: NEGATIVE
Candida Urine: NEGATIVE
Chlamydia: NEGATIVE
Comment: NEGATIVE
Comment: NEGATIVE
Comment: NORMAL
Neisseria Gonorrhea: NEGATIVE
Trichomonas: NEGATIVE

## 2019-12-22 ENCOUNTER — Telehealth: Payer: Self-pay | Admitting: Family Medicine

## 2019-12-22 NOTE — Telephone Encounter (Signed)
Called patient back to let her know that the urine culture was sent to [facility] and [facility] lab handled the cytology portion; thus generating two bills.  Patient expressed understanding.

## 2019-12-22 NOTE — Telephone Encounter (Signed)
Caller : Keisha  Call Back # [phone]  patient is calling in regards to a bill she received, patient was seen on 12/08/2019, patient states that she provided a urine sample. Patient now has two bills one from [facility] and the other from [facility].  Patient would like to know why she received two bills.   Please Advise

## 2020-03-14 ENCOUNTER — Encounter: Payer: Self-pay | Admitting: Family Medicine

## 2020-03-14 NOTE — Telephone Encounter (Signed)
That was in April--- and she should f/u pcp  Is Dr Charlett Blake not in this week?

## 2020-03-14 NOTE — Telephone Encounter (Signed)
Can patient come in for lab appt to drop off urine or do you prefer to see her again in office?

## 2020-03-17 ENCOUNTER — Other Ambulatory Visit: Payer: Self-pay

## 2020-03-17 ENCOUNTER — Ambulatory Visit (INDEPENDENT_AMBULATORY_CARE_PROVIDER_SITE_OTHER): Payer: Self-pay | Admitting: Family Medicine

## 2020-03-17 VITALS — BP 120/86 | HR 82 | Temp 98.2°F | Resp 12 | Ht 64.5 in | Wt 200.2 lb

## 2020-03-17 DIAGNOSIS — N3000 Acute cystitis without hematuria: Secondary | ICD-10-CM

## 2020-03-17 DIAGNOSIS — R300 Dysuria: Secondary | ICD-10-CM

## 2020-03-17 DIAGNOSIS — N814 Uterovaginal prolapse, unspecified: Secondary | ICD-10-CM

## 2020-03-17 DIAGNOSIS — N390 Urinary tract infection, site not specified: Secondary | ICD-10-CM | POA: Insufficient documentation

## 2020-03-17 DIAGNOSIS — M6289 Other specified disorders of muscle: Secondary | ICD-10-CM | POA: Insufficient documentation

## 2020-03-17 LAB — POC URINALSYSI DIPSTICK (AUTOMATED)
Bilirubin, UA: NEGATIVE
Glucose, UA: NEGATIVE
Ketones, UA: NEGATIVE
Nitrite, UA: NEGATIVE
Protein, UA: POSITIVE — AB
Spec Grav, UA: 1.02 (ref 1.010–1.025)
Urobilinogen, UA: 0.2 E.U./dL
pH, UA: 6 (ref 5.0–8.0)

## 2020-03-17 MED ORDER — SULFAMETHOXAZOLE-TRIMETHOPRIM 800-160 MG PO TABS: 1.0000 | ORAL_TABLET | Freq: Two times a day (BID) | ORAL | 0 refills | Status: DC

## 2020-03-17 NOTE — Assessment & Plan Note (Signed)
She notes some level of trouble since delivering her son 10 years ago. Notes some posterior prolapse and needs to physically move her uterus out of the way in order to move her bowels at times. She also notes some fecal incontinence at times with loose stool. No recent fall or trauma. Now with recurrent UTIs will refer to urogynecology at [facility] to help her decide how to proceed. Her OB/GYN has recommended surgery in the past.

## 2020-03-17 NOTE — Patient Instructions (Signed)

## 2020-03-17 NOTE — Progress Notes (Signed)
Subjective:    Patient ID: Kristen Evans, female    DOB: Apr 27, 1971, 49 y.o.   MRN: [id number]  Chief Complaint  Patient presents with  . Dysuria    HPI Patient is in today for evaluation of dysuria. She had a UTI in April and was well treated with antibiotics. She now has recurrent symptoms with dysuria, urinary frequency and urgency. No hematuria, flank pain or fevers. Denies CP/palp/SOB/HA/congestion/fevers/GI c/o. Taking meds as prescribed. She notes some level of trouble since delivering her son 10 years ago. Notes some posterior prolapse and needs to physically move her uterus out of the way in order to move her bowels at times. She also notes some fecal incontinence at times with loose stool. No recent fall or trauma.  Past Medical History:  Diagnosis Date  . Anemia    with pregnancy  . Fibrocystic breast disease   . GERD (gastroesophageal reflux disease)    pregnancy and recently  . Left ankle pain 03/04/2017  . Lipoma of lower extremity 11/21/2011  . Migraine   . Migraines 11/21/2011  . Obesity   . Preventative health care 11/21/2011  . Reflux 11/21/2011    Past Surgical History:  Procedure Laterality Date  . BREAST SURGERY  1987   biopsy    Family History  Problem Relation Age of Onset  . Cancer Mother        breast  . Hypertension Mother   . Hyperlipidemia Father   . Other Father        polycythemia  vera  . Arthritis Maternal Grandmother   . Cancer Maternal Grandmother        breast  . Hyperlipidemia Maternal Grandmother   . Stroke Maternal Grandmother   . Heart disease Maternal Grandfather   . Hyperlipidemia Maternal Grandfather   . Stroke Maternal Grandfather   . Heart disease Paternal Grandfather   . GER disease Daughter     Social History   Socioeconomic History  . Marital status: Married    Spouse name: Not on file  . Number of children: Not on file  . Years of education: Not on file  . Highest education level: Not on file  Occupational  History  . Not on file  Tobacco Use  . Smoking status: Never Smoker  . Smokeless tobacco: Never Used  Substance and Sexual Activity  . Alcohol use: No  . Drug use: No  . Sexual activity: Yes  Other Topics Concern  . Not on file  Social History Narrative  . Not on file   Social Determinants of Health   Financial Resource Strain:   . Difficulty of Paying Living Expenses:   Food Insecurity:   . Worried About Charity fundraiser in the Last Year:   . Arboriculturist in the Last Year:   Transportation Needs:   . Film/video editor (Medical):   Marland Kitchen Lack of Transportation (Non-Medical):   Physical Activity:   . Days of Exercise per Week:   . Minutes of Exercise per Session:   Stress:   . Feeling of Stress :   Social Connections:   . Frequency of Communication with Friends and Family:   . Frequency of Social Gatherings with Friends and Family:   . Attends Religious Services:   . Active Member of Clubs or Organizations:   . Attends Archivist Meetings:   Marland Kitchen Marital Status:   Intimate Partner Violence:   . Fear of Current or Ex-Partner:   .  Emotionally Abused:   Marland Kitchen Physically Abused:   . Sexually Abused:     Outpatient Medications Prior to Visit  Medication Sig Dispense Refill  . famotidine (PEPCID) 20 MG tablet Take 20 mg by mouth daily as needed for heartburn or indigestion.    Marland Kitchen loratadine (CLARITIN) 10 MG tablet Take 1 tablet by mouth daily.    . Aspirin-Acetaminophen-Caffeine (EXCEDRIN MIGRAINE PO) Take by mouth as needed.    . nitrofurantoin, macrocrystal-monohydrate, (MACROBID) 100 MG capsule Take 1 capsule (100 mg total) by mouth 2 (two) times daily. 14 capsule 0  . zolmitriptan (ZOMIG) 5 MG tablet Take 5 mg by mouth as needed.     No facility-administered medications prior to visit.    Allergies  Allergen Reactions  . Penicillins Hives    Happened in childhood, may have spread all over the body.    Review of Systems  Constitutional: Negative for  fever and malaise/fatigue.  HENT: Negative for congestion.   Eyes: Negative for blurred vision.  Respiratory: Negative for shortness of breath.   Cardiovascular: Negative for chest pain, palpitations and leg swelling.  Gastrointestinal: Negative for abdominal pain, blood in stool and nausea.  Genitourinary: Positive for dysuria, frequency and urgency. Negative for flank pain and hematuria.  Musculoskeletal: Negative for falls.  Skin: Negative for rash.  Neurological: Negative for dizziness, loss of consciousness and headaches.  Endo/Heme/Allergies: Negative for environmental allergies.  Psychiatric/Behavioral: Negative for depression. The patient is not nervous/anxious.        Objective:    Physical Exam Vitals and nursing note reviewed.  Constitutional:      General: She is not in acute distress.    Appearance: She is well-developed.  HENT:     Head: Normocephalic and atraumatic.     Nose: Nose normal.  Eyes:     General:        Right eye: No discharge.        Left eye: No discharge.  Cardiovascular:     Rate and Rhythm: Normal rate and regular rhythm.     Heart sounds: No murmur heard.   Pulmonary:     Effort: Pulmonary effort is normal.     Breath sounds: Normal breath sounds.  Abdominal:     General: Bowel sounds are normal.     Palpations: Abdomen is soft.     Tenderness: There is no abdominal tenderness.  Musculoskeletal:     Cervical back: Normal range of motion and neck supple.  Skin:    General: Skin is warm and dry.  Neurological:     Mental Status: She is alert and oriented to person, place, and time.     BP 120/86 (BP Location: Right Arm, Cuff Size: Large)   Pulse 82   Temp 98.2 F (36.8 C) (Temporal)   Resp 12   Ht 5' 4.5" (1.638 m)   Wt 200 lb 3.2 oz (90.8 kg)   SpO2 98%   BMI 33.83 kg/m  Wt Readings from Last 3 Encounters:  03/17/20 200 lb 3.2 oz (90.8 kg)  12/08/19 202 lb 9.6 oz (91.9 kg)  06/02/14 184 lb (83.5 kg)    Diabetic Foot Exam  - Simple   No data filed     Lab Results  Component Value Date   WBC 10.1 03/04/2017   HGB 14.1 03/04/2017   HCT 41.8 03/04/2017   PLT 258.0 03/04/2017   GLUCOSE 98 03/04/2017   ALT 18 03/04/2017   AST 15 03/04/2017   NA 139 03/04/2017  K 4.1 03/04/2017   CL 104 03/04/2017   CREATININE 0.85 03/04/2017   BUN 12 03/04/2017   CO2 29 03/04/2017    No results found for: TSH Lab Results  Component Value Date   WBC 10.1 03/04/2017   HGB 14.1 03/04/2017   HCT 41.8 03/04/2017   MCV 89.1 03/04/2017   PLT 258.0 03/04/2017   Lab Results  Component Value Date   NA 139 03/04/2017   K 4.1 03/04/2017   CO2 29 03/04/2017   GLUCOSE 98 03/04/2017   BUN 12 03/04/2017   CREATININE 0.85 03/04/2017   BILITOT 0.3 03/04/2017   ALKPHOS 52 03/04/2017   AST 15 03/04/2017   ALT 18 03/04/2017   PROT 7.3 03/04/2017   ALBUMIN 4.3 03/04/2017   CALCIUM 9.9 03/04/2017   GFR 76.08 03/04/2017   No results found for: CHOL No results found for: HDL No results found for: LDLCALC No results found for: TRIG No results found for: CHOLHDL No results found for: HGBA1C     Assessment & Plan:   Problem List Items Addressed This Visit    UTI (urinary tract infection)    Had an infection back in April and symptoms have recurred this week with dysuria, frequency and urgency. Started on Bactrim DS bid due to leukocytes in urine and symptoms. Sent for culture. Also encouraged to add a cranberry tab and a probiotics.      Relevant Medications   sulfamethoxazole-trimethoprim (BACTRIM DS) 800-160 MG tablet   Pelvic floor dysfunction in female    She notes some level of trouble since delivering her son 10 years ago. Notes some posterior prolapse and needs to physically move her uterus out of the way in order to move her bowels at times. She also notes some fecal incontinence at times with loose stool. No recent fall or trauma. Now with recurrent UTIs will refer to urogynecology at [facility] to help her decide  how to proceed. Her OB/GYN has recommended surgery in the past.        Other Visit Diagnoses    Dysuria    -  Primary   Relevant Orders   POCT Urinalysis Dipstick (Automated) (Completed)   Urine Culture   Ambulatory referral to Urogynecology   Uterine prolapse       Relevant Orders   Ambulatory referral to Urogynecology      I have discontinued Anderson Malta B. Watts's zolmitriptan, Aspirin-Acetaminophen-Caffeine (EXCEDRIN MIGRAINE PO), and nitrofurantoin (macrocrystal-monohydrate). I am also having her start on sulfamethoxazole-trimethoprim. Additionally, I am having her maintain her loratadine and famotidine.  Meds ordered this encounter  Medications  . sulfamethoxazole-trimethoprim (BACTRIM DS) 800-160 MG tablet    Sig: Take 1 tablet by mouth 2 (two) times daily.    Dispense:  14 tablet    Refill:  0     Penni Homans, MD

## 2020-03-17 NOTE — Assessment & Plan Note (Signed)
Had an infection back in April and symptoms have recurred this week with dysuria, frequency and urgency. Started on Bactrim DS bid due to leukocytes in urine and symptoms. Sent for culture. Also encouraged to add a cranberry tab and a probiotics.

## 2020-03-19 LAB — URINE CULTURE
MICRO NUMBER:: 10709494
SPECIMEN QUALITY:: ADEQUATE

## 2020-04-07 ENCOUNTER — Encounter: Payer: Self-pay | Admitting: Family Medicine

## 2020-06-03 HISTORY — PX: OTHER SURGICAL HISTORY: SHX169

## 2020-08-25 ENCOUNTER — Encounter: Payer: Self-pay | Admitting: Family Medicine

## 2020-08-25 ENCOUNTER — Other Ambulatory Visit: Payer: Self-pay

## 2020-08-25 ENCOUNTER — Ambulatory Visit (INDEPENDENT_AMBULATORY_CARE_PROVIDER_SITE_OTHER): Payer: Self-pay | Admitting: Family Medicine

## 2020-08-25 DIAGNOSIS — T7840XA Allergy, unspecified, initial encounter: Secondary | ICD-10-CM

## 2020-08-25 DIAGNOSIS — Z0000 Encounter for general adult medical examination without abnormal findings: Secondary | ICD-10-CM

## 2020-08-25 DIAGNOSIS — E785 Hyperlipidemia, unspecified: Secondary | ICD-10-CM

## 2020-08-25 DIAGNOSIS — Z23 Encounter for immunization: Secondary | ICD-10-CM

## 2020-08-25 DIAGNOSIS — Z7289 Other problems related to lifestyle: Secondary | ICD-10-CM

## 2020-08-25 DIAGNOSIS — K219 Gastro-esophageal reflux disease without esophagitis: Secondary | ICD-10-CM

## 2020-08-25 DIAGNOSIS — Z1211 Encounter for screening for malignant neoplasm of colon: Secondary | ICD-10-CM

## 2020-08-25 DIAGNOSIS — N6452 Nipple discharge: Secondary | ICD-10-CM

## 2020-08-25 LAB — COMPREHENSIVE METABOLIC PANEL
ALT: 17 U/L (ref 0–35)
AST: 13 U/L (ref 0–37)
Albumin: 4.2 g/dL (ref 3.5–5.2)
Alkaline Phosphatase: 56 U/L (ref 39–117)
BUN: 10 mg/dL (ref 6–23)
CO2: 29 mEq/L (ref 19–32)
Calcium: 9.1 mg/dL (ref 8.4–10.5)
Chloride: 104 mEq/L (ref 96–112)
Creatinine, Ser: 0.8 mg/dL (ref 0.40–1.20)
GFR: 85.67 mL/min (ref >60.00)
Glucose, Bld: 94 mg/dL (ref 70–99)
Potassium: 4.2 mEq/L (ref 3.5–5.1)
Sodium: 139 mEq/L (ref 135–145)
Total Bilirubin: 0.4 mg/dL (ref 0.2–1.2)
Total Protein: 6.8 g/dL (ref 6.0–8.3)

## 2020-08-25 LAB — CBC WITH DIFFERENTIAL/PLATELET
Basophils Absolute: 0 10*3/uL (ref 0.0–0.1)
Basophils Relative: 0.5 % (ref 0.0–3.0)
Eosinophils Absolute: 0.1 10*3/uL (ref 0.0–0.7)
Eosinophils Relative: 1.9 % (ref 0.0–5.0)
HCT: 38.4 % (ref 36.0–46.0)
Hemoglobin: 12.5 g/dL (ref 12.0–15.0)
Lymphocytes Relative: 20.2 % (ref 12.0–46.0)
Lymphs Abs: 1.3 10*3/uL (ref 0.7–4.0)
MCHC: 32.6 g/dL (ref 30.0–36.0)
MCV: 86.8 fl (ref 78.0–100.0)
Monocytes Absolute: 0.4 10*3/uL (ref 0.1–1.0)
Monocytes Relative: 6.3 % (ref 3.0–12.0)
Neutro Abs: 4.7 10*3/uL (ref 1.4–7.7)
Neutrophils Relative %: 71.1 % (ref 43.0–77.0)
Platelets: 231 10*3/uL (ref 150.0–400.0)
RBC: 4.42 Mil/uL (ref 3.87–5.11)
RDW: 13.9 % (ref 11.5–15.5)
WBC: 6.6 10*3/uL (ref 4.0–10.5)

## 2020-08-25 LAB — LIPID PANEL
Cholesterol: 231 mg/dL — ABNORMAL HIGH (ref 0–200)
HDL: 59.9 mg/dL (ref >39.00)
LDL Cholesterol: 152 mg/dL — ABNORMAL HIGH (ref 0–99)
NonHDL: 170.96
Total CHOL/HDL Ratio: 4
Triglycerides: 97 mg/dL (ref 0.0–149.0)
VLDL: 19.4 mg/dL (ref 0.0–40.0)

## 2020-08-25 LAB — TSH: TSH: 1.35 u[IU]/mL (ref 0.35–4.50)

## 2020-08-25 NOTE — Patient Instructions (Addendum)
Preventive Care 19-50 Years Old, Female Preventive care refers to visits with your health care provider and lifestyle choices that can promote health and wellness. This includes:  A yearly physical exam. This may also be called an annual well check.  Regular dental visits and eye exams.  Immunizations.  Screening for certain conditions.  Healthy lifestyle choices, such as eating a healthy diet, getting regular exercise, not using drugs or products that contain nicotine and tobacco, and limiting alcohol use. What can I expect for my preventive care visit? Physical exam Your health care provider will check your:  Height and weight. This may be used to calculate body mass index (BMI), which tells if you are at a healthy weight.  Heart rate and blood pressure.  Skin for abnormal spots. Counseling Your health care provider may ask you questions about your:  Alcohol, tobacco, and drug use.  Emotional well-being.  Home and relationship well-being.  Sexual activity.  Eating habits.  Work and work Statistician.  Method of birth control.  Menstrual cycle.  Pregnancy history. What immunizations do I need?  Influenza (flu) vaccine  This is recommended every year. Tetanus, diphtheria, and pertussis (Tdap) vaccine  You may need a Td booster every 10 years. Varicella (chickenpox) vaccine  You may need this if you have not been vaccinated. Zoster (shingles) vaccine  You may need this after age 75. Measles, mumps, and rubella (MMR) vaccine  You may need at least one dose of MMR if you were born in 1957 or later. You may also need a second dose. Pneumococcal conjugate (PCV13) vaccine  You may need this if you have certain conditions and were not previously vaccinated. Pneumococcal polysaccharide (PPSV23) vaccine  You may need one or two doses if you smoke cigarettes or if you have certain conditions. Meningococcal conjugate (MenACWY) vaccine  You may need this if  you have certain conditions. Hepatitis A vaccine  You may need this if you have certain conditions or if you travel or work in places where you may be exposed to hepatitis A. Hepatitis B vaccine  You may need this if you have certain conditions or if you travel or work in places where you may be exposed to hepatitis B. Haemophilus influenzae type b (Hib) vaccine  You may need this if you have certain conditions. Human papillomavirus (HPV) vaccine  If recommended by your health care provider, you may need three doses over 6 months. You may receive vaccines as individual doses or as more than one vaccine together in one shot (combination vaccines). Talk with your health care provider about the risks and benefits of combination vaccines. What tests do I need? Blood tests  Lipid and cholesterol levels. These may be checked every 5 years, or more frequently if you are over 30 years old.  Hepatitis C test.  Hepatitis B test. Screening  Lung cancer screening. You may have this screening every year starting at age 44 if you have a 30-pack-year history of smoking and currently smoke or have quit within the past 15 years.  Colorectal cancer screening. All adults should have this screening starting at age 52 and continuing until age 36. Your health care provider may recommend screening at age 70 if you are at increased risk. You will have tests every 1-10 years, depending on your results and the type of screening test.  Diabetes screening. This is done by checking your blood sugar (glucose) after you have not eaten for a while (fasting). You may  have this done every 1-3 years.  Mammogram. This may be done every 1-2 years. Talk with your health care provider about when you should start having regular mammograms. This may depend on whether you have a family history of breast cancer.  BRCA-related cancer screening. This may be done if you have a family history of breast, ovarian, tubal, or  peritoneal cancers.  Pelvic exam and Pap test. This may be done every 3 years starting at age 46. Starting at age 58, this may be done every 5 years if you have a Pap test in combination with an HPV test. Other tests  Sexually transmitted disease (STD) testing.  Bone density scan. This is done to screen for osteoporosis. You may have this scan if you are at high risk for osteoporosis. Follow these instructions at home: Eating and drinking  Eat a diet that includes fresh fruits and vegetables, whole grains, lean protein, and low-fat dairy.  Take vitamin and mineral supplements as recommended by your health care provider.  Do not drink alcohol if: ? Your health care provider tells you not to drink. ? You are pregnant, may be pregnant, or are planning to become pregnant.  If you drink alcohol: ? Limit how much you have to 0-1 drink a day. ? Be aware of how much alcohol is in your drink. In the [place] one drink equals one 12 oz bottle of beer (355 mL), one 5 oz glass of wine (148 mL), or one 1 oz glass of hard liquor (44 mL). Lifestyle  Take daily care of your teeth and gums.  Stay active. Exercise for at least 30 minutes on 5 or more days each week.  Do not use any products that contain nicotine or tobacco, such as cigarettes, e-cigarettes, and chewing tobacco. If you need help quitting, ask your health care provider.  If you are sexually active, practice safe sex. Use a condom or other form of birth control (contraception) in order to prevent pregnancy and STIs (sexually transmitted infections).  If told by your health care provider, take low-dose aspirin daily starting at age 56. What's next?  Visit your health care provider once a year for a well check visit.  Ask your health care provider how often you should have your eyes and teeth checked.  Stay up to date on all vaccines. This information is not intended to replace advice given to you by your health care provider. Make  sure you discuss any questions you have with your health care provider. Document Revised: 05/01/2018 Document Reviewed: 05/01/2018 Elsevier Patient Education  2020 [employer].

## 2020-08-25 NOTE — Assessment & Plan Note (Addendum)
Patient encouraged to maintain heart healthy diet, regular exercise, adequate sleep. Consider daily probiotics. Take medications as prescribed. Labs reviewed and ordered. Referred to GI for colonoscopy. Pap in 2019, is due next year.

## 2020-08-26 LAB — HEPATITIS C ANTIBODY
Hepatitis C Ab: NONREACTIVE
SIGNAL TO CUT-OFF: 0 (ref <1.00)

## 2020-08-30 DIAGNOSIS — E785 Hyperlipidemia, unspecified: Secondary | ICD-10-CM | POA: Insufficient documentation

## 2020-08-30 DIAGNOSIS — N6452 Nipple discharge: Secondary | ICD-10-CM | POA: Insufficient documentation

## 2020-08-30 DIAGNOSIS — T7840XA Allergy, unspecified, initial encounter: Secondary | ICD-10-CM | POA: Insufficient documentation

## 2020-08-30 NOTE — Progress Notes (Signed)
Subjective:    Patient ID: Kristen Evans, female    DOB: [birth year], 49 y.o.   MRN: [id number]  Chief Complaint  Patient presents with  . Annual Exam    Pt states that she been having discharge from left breast.    HPI Patient is in today for annual preventative exam and follow up on chronic medical concerns. No recent febrile illness or hospitalizations. She is noting a recent onset of discharge from her left breast which is intermittent and clear. No pain or other lesions associated. She also notes some increased allergic symptoms with PND and some sense of globus. Has used Claritin alternately with Flonase with partial improvement. She reports otherwise feeling well.. Denies CP/palp/SOB/HA/fevers/GI or GU c/o. Taking meds as prescribed  Past Medical History:  Diagnosis Date  . Anemia    with pregnancy  . Fibrocystic breast disease   . GERD (gastroesophageal reflux disease)    pregnancy and recently  . Left ankle pain 03/04/2017  . Lipoma of lower extremity 11/21/2011  . Migraine   . Migraines 11/21/2011  . Obesity   . Preventative health care 11/21/2011  . Reflux 11/21/2011    Past Surgical History:  Procedure Laterality Date  . ANTERIOR AND POSTERIOR VAGINAL REPAIR    . bilateral apical suspension    . BREAST SURGERY  1987   biopsy  . perineorrhaphy      Family History  Problem Relation Age of Onset  . Cancer Mother        breast  . Hypertension Mother   . Hyperlipidemia Father   . Other Father        polycythemia  vera  . Heart disease Father        CAD s/p CABG x 4 vessels  . Arthritis Maternal Grandmother   . Cancer Maternal Grandmother        breast  . Hyperlipidemia Maternal Grandmother   . Stroke Maternal Grandmother   . Heart disease Maternal Grandfather   . Hyperlipidemia Maternal Grandfather   . Stroke Maternal Grandfather   . Heart disease Paternal Grandfather   . GER disease Daughter     Social History   Socioeconomic History  . Marital  status: Married    Spouse name: Not on file  . Number of children: Not on file  . Years of education: Not on file  . Highest education level: Not on file  Occupational History  . Not on file  Tobacco Use  . Smoking status: Never Smoker  . Smokeless tobacco: Never Used  Substance and Sexual Activity  . Alcohol use: No  . Drug use: No  . Sexual activity: Yes  Other Topics Concern  . Not on file  Social History Narrative  . Not on file   Social Determinants of Health   Financial Resource Strain: Not on file  Food Insecurity: Not on file  Transportation Needs: Not on file  Physical Activity: Not on file  Stress: Not on file  Social Connections: Not on file  Intimate Partner Violence: Not on file    Outpatient Medications Prior to Visit  Medication Sig Dispense Refill  . famotidine (PEPCID) 20 MG tablet Take 20 mg by mouth daily as needed for heartburn or indigestion.    Marland Kitchen loratadine (CLARITIN) 10 MG tablet Take 1 tablet by mouth daily.    Marland Kitchen sulfamethoxazole-trimethoprim (BACTRIM DS) 800-160 MG tablet Take 1 tablet by mouth 2 (two) times daily. 14 tablet 0   No facility-administered medications prior  to visit.    Allergies  Allergen Reactions  . Penicillins Hives    Happened in childhood, may have spread all over the body.    Review of Systems  Constitutional: Negative for fever and malaise/fatigue.  HENT: Positive for congestion.   Eyes: Negative for blurred vision.  Respiratory: Negative for shortness of breath.   Cardiovascular: Negative for chest pain, palpitations and leg swelling.  Gastrointestinal: Negative for abdominal pain, blood in stool and nausea.  Genitourinary: Negative for dysuria and frequency.  Musculoskeletal: Negative for falls.  Skin: Negative for rash.  Neurological: Negative for dizziness, loss of consciousness and headaches.  Endo/Heme/Allergies: Negative for environmental allergies.  Psychiatric/Behavioral: Negative for depression. The  patient is not nervous/anxious.        Objective:    Physical Exam Constitutional:      General: She is not in acute distress.    Appearance: She is well-developed and well-nourished.  HENT:     Head: Normocephalic and atraumatic.  Eyes:     Conjunctiva/sclera: Conjunctivae normal.  Neck:     Thyroid: No thyromegaly.  Cardiovascular:     Rate and Rhythm: Normal rate and regular rhythm.     Heart sounds: Normal heart sounds. No murmur heard.   Pulmonary:     Effort: Pulmonary effort is normal. No respiratory distress.     Breath sounds: Normal breath sounds.  Abdominal:     General: Bowel sounds are normal. There is no distension.     Palpations: Abdomen is soft. There is no mass.     Tenderness: There is no abdominal tenderness.  Musculoskeletal:        General: No edema.     Cervical back: Neck supple.  Lymphadenopathy:     Cervical: No cervical adenopathy.  Skin:    General: Skin is warm and dry.  Neurological:     Mental Status: She is alert and oriented to person, place, and time.  Psychiatric:        Mood and Affect: Mood and affect normal.        Behavior: Behavior normal.     There were no vitals taken for this visit. Wt Readings from Last 3 Encounters:  03/17/20 200 lb 3.2 oz (90.8 kg)  12/08/19 202 lb 9.6 oz (91.9 kg)  06/02/14 184 lb (83.5 kg)    Diabetic Foot Exam - Simple   No data filed    Lab Results  Component Value Date   WBC 6.6 08/25/2020   HGB 12.5 08/25/2020   HCT 38.4 08/25/2020   PLT 231.0 08/25/2020   GLUCOSE 94 08/25/2020   CHOL 231 (H) 08/25/2020   TRIG 97.0 08/25/2020   HDL 59.90 08/25/2020   LDLCALC 152 (H) 08/25/2020   ALT 17 08/25/2020   AST 13 08/25/2020   NA 139 08/25/2020   K 4.2 08/25/2020   CL 104 08/25/2020   CREATININE 0.80 08/25/2020   BUN 10 08/25/2020   CO2 29 08/25/2020   TSH 1.35 08/25/2020    Lab Results  Component Value Date   TSH 1.35 08/25/2020   Lab Results  Component Value Date   WBC 6.6  08/25/2020   HGB 12.5 08/25/2020   HCT 38.4 08/25/2020   MCV 86.8 08/25/2020   PLT 231.0 08/25/2020   Lab Results  Component Value Date   NA 139 08/25/2020   K 4.2 08/25/2020   CO2 29 08/25/2020   GLUCOSE 94 08/25/2020   BUN 10 08/25/2020   CREATININE 0.80 08/25/2020  BILITOT 0.4 08/25/2020   ALKPHOS 56 08/25/2020   AST 13 08/25/2020   ALT 17 08/25/2020   PROT 6.8 08/25/2020   ALBUMIN 4.2 08/25/2020   CALCIUM 9.1 08/25/2020   GFR 85.67 08/25/2020   Lab Results  Component Value Date   CHOL 231 (H) 08/25/2020   Lab Results  Component Value Date   HDL 59.90 08/25/2020   Lab Results  Component Value Date   LDLCALC 152 (H) 08/25/2020   Lab Results  Component Value Date   TRIG 97.0 08/25/2020   Lab Results  Component Value Date   CHOLHDL 4 08/25/2020   No results found for: HGBA1C     Assessment & Plan:   Problem List Items Addressed This Visit    Acid reflux    Consider that this could be contributing to sense of globus. Use Pepcid 20-40 mg qhs and see if that is helpful      Preventative health care - Primary    Patient encouraged to maintain heart healthy diet, regular exercise, adequate sleep. Consider daily probiotics. Take medications as prescribed. Labs reviewed and ordered. Referred to GI for colonoscopy. Pap in 2019, is due next year.      Relevant Orders   CBC with Differential/Platelet (Completed)   Comprehensive metabolic panel (Completed)   TSH (Completed)   Lipid panel (Completed)   Discharge from breast    Left breast discharge, clear in appearance but does look a little yellow when it dries in her bra. She follows with [facility] so will refer her back to them for further consideration.       Relevant Orders   Ambulatory referral to Breast Clinic   Hyperlipidemia    Encouraged heart healthy diet such as MIND or DASH diet, increase exercise, avoid trans fats, consider a krill or fish oil cap daily      Allergies    Has tried Claritin and  flonase alternately, encouraged to try both together ans see how that works.        Other Visit Diagnoses    Colon cancer screening       Relevant Orders   Ambulatory referral to Gastroenterology   Other problems related to lifestyle       Relevant Orders   Hepatitis C Antibody (Completed)   Need for Tdap vaccination       Relevant Orders   Tdap vaccine greater than or equal to 7yo IM (Completed)      I am having Kristen Evans maintain her loratadine, famotidine, and sulfamethoxazole-trimethoprim.  No orders of the defined types were placed in this encounter.    Penni Homans, MD

## 2020-08-30 NOTE — Assessment & Plan Note (Signed)
Consider that this could be contributing to sense of globus. Use Pepcid 20-40 mg qhs and see if that is helpful

## 2020-08-30 NOTE — Assessment & Plan Note (Signed)
Left breast discharge, clear in appearance but does look a little yellow when it dries in her bra. She follows with [facility] so will refer her back to them for further consideration.

## 2020-08-30 NOTE — Assessment & Plan Note (Signed)
Has tried Claritin and flonase alternately, encouraged to try both together ans see how that works.

## 2020-08-30 NOTE — Assessment & Plan Note (Signed)
Encouraged heart healthy diet such as MIND or DASH diet, increase exercise, avoid trans fats, consider a krill or fish oil cap daily

## 2020-09-28 ENCOUNTER — Encounter: Payer: Self-pay | Admitting: Family Medicine

## 2020-09-28 LAB — HM MAMMOGRAPHY

## 2020-11-03 ENCOUNTER — Ambulatory Visit (AMBULATORY_SURGERY_CENTER): Payer: Self-pay | Admitting: *Deleted

## 2020-11-03 ENCOUNTER — Other Ambulatory Visit: Payer: Self-pay

## 2020-11-03 VITALS — Ht 64.5 in | Wt 198.0 lb

## 2020-11-03 DIAGNOSIS — Z1211 Encounter for screening for malignant neoplasm of colon: Secondary | ICD-10-CM

## 2020-11-03 NOTE — Progress Notes (Signed)
No egg or soy allergy known to patient  No issues with past sedation with any surgeries or procedures No intubation problems in the past  No FH of Malignant Hyperthermia No diet pills per patient No home 02 use per patient  No blood thinners per patient  Pt denies issues with constipation  No A fib or A flutter  EMMI video to pt or via [place] 19 guidelines implemented in [place] today with Pt and RN  Pt is fully vaccinated  for [employer] as pt is self pay  Lot [id number] Exp 04/23  Due to the COVID-19 pandemic we are asking patients to follow certain guidelines.  Pt aware of COVID protocols and [facility] guidelines

## 2020-11-17 ENCOUNTER — Ambulatory Visit (AMBULATORY_SURGERY_CENTER): Payer: Self-pay | Admitting: Internal Medicine

## 2020-11-17 ENCOUNTER — Other Ambulatory Visit: Payer: Self-pay

## 2020-11-17 ENCOUNTER — Encounter: Payer: Self-pay | Admitting: Internal Medicine

## 2020-11-17 VITALS — BP 110/70 | HR 66 | Temp 97.3°F | Resp 13 | Ht 64.5 in | Wt 198.0 lb

## 2020-11-17 DIAGNOSIS — Z1211 Encounter for screening for malignant neoplasm of colon: Secondary | ICD-10-CM

## 2020-11-17 MED ORDER — SODIUM CHLORIDE 0.9 % IV SOLN: 500.0000 mL | Freq: Once | INTRAVENOUS | Status: DC

## 2020-11-17 NOTE — Op Note (Signed)
[facility] Patient Name: Kristen Evans Procedure Date: 11/17/2020 11:33 AM MRN: [id number] Endoscopist: Docia Chuck. Henrene Pastor , MD Age: 50 Referring MD:  Date of Birth: 1971-05-07 Gender: Female Account #: [id number] Procedure:                Colonoscopy Indications:              Screening for colorectal malignant neoplasm Medicines:                Monitored Anesthesia Care Procedure:                Pre-Anesthesia Assessment:                           - Prior to the procedure, a History and Physical                            was performed, and patient medications and                            allergies were reviewed. The patient's tolerance of                            previous anesthesia was also reviewed. The risks                            and benefits of the procedure and the sedation                            options and risks were discussed with the patient.                            All questions were answered, and informed consent                            was obtained. Prior Anticoagulants: The patient has                            taken no previous anticoagulant or antiplatelet                            agents. ASA Grade Assessment: I - A normal, healthy                            patient. After reviewing the risks and benefits,                            the patient was deemed in satisfactory condition to                            undergo the procedure.                           After obtaining informed consent, the colonoscope  was passed under direct vision. Throughout the                            procedure, the patient's blood pressure, pulse, and                            oxygen saturations were monitored continuously. The                            Olympus CF-[id number]) Colonoscope was                            introduced through the anus and advanced to the the                            cecum, identified by  appendiceal orifice and                            ileocecal valve. The ileocecal valve, appendiceal                            orifice, and rectum were photographed. The quality                            of the bowel preparation was excellent. The                            colonoscopy was performed without difficulty. The                            patient tolerated the procedure well. The bowel                            preparation used was SUPREP via split dose                            instruction. Scope In: 11:46:37 AM Scope Out: 12:00:40 PM Scope Withdrawal Time: 0 hours 11 minutes 9 seconds  Total Procedure Duration: 0 hours 14 minutes 3 seconds  Findings:                 Multiple diverticula were found in the left colon                            and right colon.                           Internal hemorrhoids were found during retroflexion.                           The exam was otherwise without abnormality on                            direct and retroflexion views. Complications:            No immediate complications.  Estimated blood loss:                            None. Estimated Blood Loss:     Estimated blood loss: none. Impression:               - Diverticulosis in the left colon and in the right                            colon.                           - Internal hemorrhoids.                           - The examination was otherwise normal on direct                            and retroflexion views.                           - No specimens collected. Recommendation:           - Repeat colonoscopy in 10 years for screening                            purposes.                           - Patient has a contact number available for                            emergencies. The signs and symptoms of potential                            delayed complications were discussed with the                            patient. Return to normal activities tomorrow.                             Written discharge instructions were provided to the                            patient.                           - Resume previous diet.                           - Continue present medications. Docia Chuck. Henrene Pastor, MD 11/17/2020 12:06:25 PM This report has been signed electronically.

## 2020-11-17 NOTE — Patient Instructions (Signed)
Handouts Provided:  Diverticulosis ? ?YOU HAD AN ENDOSCOPIC PROCEDURE TODAY AT THE [facility]:   Refer to the procedure report that was given to you for any specific questions about what was found during the examination.  If the procedure report does not answer your questions, please call your gastroenterologist to clarify.  If you requested that your care partner not be given the details of your procedure findings, then the procedure report has been included in a sealed envelope for you to review at your convenience later. ? ?YOU SHOULD EXPECT: Some feelings of bloating in the abdomen. Passage of more gas than usual.  Walking can help get rid of the air that was put into your GI tract during the procedure and reduce the bloating. If you had a lower endoscopy (such as a colonoscopy or flexible sigmoidoscopy) you may notice spotting of blood in your stool or on the toilet paper. If you underwent a bowel prep for your procedure, you may not have a normal bowel movement for a few days. ? ?Please Note:  You might notice some irritation and congestion in your nose or some drainage.  This is from the oxygen used during your procedure.  There is no need for concern and it should clear up in a day or so. ? ?SYMPTOMS TO REPORT IMMEDIATELY: ? ?Following lower endoscopy (colonoscopy or flexible sigmoidoscopy): ? Excessive amounts of blood in the stool ? Significant tenderness or worsening of abdominal pains ? Swelling of the abdomen that is new, acute ? Fever of 100?F or higher ? ?For urgent or emergent issues, a gastroenterologist can be reached at any hour by calling [phone]. ?Do not use MyChart messaging for urgent concerns.  ? ? ?DIET:  We do recommend a small meal at first, but then you may proceed to your regular diet.  Drink plenty of fluids but you should avoid alcoholic beverages for 24 hours. ? ?ACTIVITY:  You should plan to take it easy for the rest of today and you should NOT DRIVE or use heavy  machinery until tomorrow (because of the sedation medicines used during the test).   ? ?FOLLOW UP: ?Our staff will call the number listed on your records 48-72 hours following your procedure to check on you and address any questions or concerns that you may have regarding the information given to you following your procedure. If we do not reach you, we will leave a message.  We will attempt to reach you two times.  During this call, we will ask if you have developed any symptoms of COVID 19. If you develop any symptoms (ie: fever, flu-like symptoms, shortness of breath, cough etc.) before then, please call [phone].  If you test positive for Covid 19 in the 2 weeks post procedure, please call and report this information to us.   ? ?If any biopsies were taken you will be contacted by phone or by letter within the next 1-3 weeks.  Please call us at [phone] if you have not heard about the biopsies in 3 weeks.  ? ? ?SIGNATURES/CONFIDENTIALITY: ?You and/or your care partner have signed paperwork which will be entered into your electronic medical record.  These signatures attest to the fact that that the information above on your After Visit Summary has been reviewed and is understood.  Full responsibility of the confidentiality of this discharge information lies with you and/or your care-partner. ? ?

## 2020-11-17 NOTE — Progress Notes (Signed)
Vitals-CW  Pt's states no medical or surgical changes since previsit or office visit. 

## 2020-11-17 NOTE — Progress Notes (Signed)
PT taken to PACU. Monitors in place. VSS. Report given to RN. 

## 2020-11-21 ENCOUNTER — Telehealth: Payer: Self-pay | Admitting: *Deleted

## 2020-11-21 ENCOUNTER — Telehealth: Payer: Self-pay

## 2020-11-21 NOTE — Telephone Encounter (Signed)
  Follow up Call-  Call back number 11/17/2020  Post procedure Call Back phone  # [phone]  Permission to leave phone message Yes  Some recent data might be hidden     Patient questions:  Do you have a fever, pain , or abdominal swelling? No. Pain Score  0 *  Have you tolerated food without any problems? Yes.    Have you been able to return to your normal activities? Yes.    Do you have any questions about your discharge instructions: Diet   No. Medications  No. Follow up visit  No.  Do you have questions or concerns about your Care? No.  Actions: * If pain score is 4 or above: No action needed, pain <4. 1. Have you developed a fever since your procedure? no  2.   Have you had an respiratory symptoms (SOB or cough) since your procedure? no  3.   Have you tested positive for COVID 19 since your procedure no  4.   Have you had any family members/close contacts diagnosed with the COVID 19 since your procedure?  no   If yes to any of these questions please route to Joylene John, RN and Joella Prince, RN

## 2020-11-21 NOTE — Telephone Encounter (Signed)
No answer, left message to call back later today, B.Johnmichael Melhorn RN. 

## 2021-03-30 ENCOUNTER — Encounter: Payer: Self-pay | Admitting: *Deleted

## 2021-03-30 LAB — HM MAMMOGRAPHY

## 2021-04-04 ENCOUNTER — Encounter: Payer: Self-pay | Admitting: *Deleted

## 2021-04-08 ENCOUNTER — Encounter: Payer: Self-pay | Admitting: Family Medicine

## 2021-04-10 ENCOUNTER — Other Ambulatory Visit: Payer: Self-pay | Admitting: Diagnostic Radiology

## 2021-04-10 DIAGNOSIS — Z803 Family history of malignant neoplasm of breast: Secondary | ICD-10-CM

## 2021-04-10 DIAGNOSIS — N6452 Nipple discharge: Secondary | ICD-10-CM

## 2021-11-07 ENCOUNTER — Encounter: Payer: Self-pay | Admitting: Family Medicine

## 2021-11-08 ENCOUNTER — Encounter: Payer: Self-pay | Admitting: Family Medicine

## 2021-11-08 ENCOUNTER — Other Ambulatory Visit (HOSPITAL_COMMUNITY)
Admission: RE | Admit: 2021-11-08 | Discharge: 2021-11-08 | Disposition: A | Discharge status: Home / Self Care | Payer: 59 | Source: Ambulatory Visit | Attending: Family Medicine | Admitting: Family Medicine

## 2021-11-08 ENCOUNTER — Ambulatory Visit (INDEPENDENT_AMBULATORY_CARE_PROVIDER_SITE_OTHER): Payer: 59 | Admitting: Family Medicine

## 2021-11-08 VITALS — BP 123/83 | HR 86 | Ht 64.5 in | Wt 199.4 lb

## 2021-11-08 DIAGNOSIS — N6452 Nipple discharge: Secondary | ICD-10-CM

## 2021-11-08 DIAGNOSIS — Z6833 Body mass index (BMI) 33.0-33.9, adult: Secondary | ICD-10-CM

## 2021-11-08 DIAGNOSIS — Z124 Encounter for screening for malignant neoplasm of cervix: Secondary | ICD-10-CM

## 2021-11-08 DIAGNOSIS — E785 Hyperlipidemia, unspecified: Secondary | ICD-10-CM

## 2021-11-08 DIAGNOSIS — Z0001 Encounter for general adult medical examination with abnormal findings: Secondary | ICD-10-CM

## 2021-11-08 DIAGNOSIS — E669 Obesity, unspecified: Secondary | ICD-10-CM

## 2021-11-08 LAB — CBC
HCT: 40.9 % (ref 36.0–46.0)
Hemoglobin: 13.6 g/dL (ref 12.0–15.0)
MCHC: 33.4 g/dL (ref 30.0–36.0)
MCV: 88.5 fl (ref 78.0–100.0)
Platelets: 216 10*3/uL (ref 150.0–400.0)
RBC: 4.62 Mil/uL (ref 3.87–5.11)
RDW: 14.1 % (ref 11.5–15.5)
WBC: 6.3 10*3/uL (ref 4.0–10.5)

## 2021-11-08 LAB — LIPID PANEL
Cholesterol: 245 mg/dL — ABNORMAL HIGH (ref 0–200)
HDL: 61.1 mg/dL (ref >39.00)
LDL Cholesterol: 155 mg/dL — ABNORMAL HIGH (ref 0–99)
NonHDL: 183.41
Total CHOL/HDL Ratio: 4
Triglycerides: 140 mg/dL (ref 0.0–149.0)
VLDL: 28 mg/dL (ref 0.0–40.0)

## 2021-11-08 LAB — COMPREHENSIVE METABOLIC PANEL
ALT: 23 U/L (ref 0–35)
AST: 17 U/L (ref 0–37)
Albumin: 4.5 g/dL (ref 3.5–5.2)
Alkaline Phosphatase: 54 U/L (ref 39–117)
BUN: 9 mg/dL (ref 6–23)
CO2: 31 mEq/L (ref 19–32)
Calcium: 9.5 mg/dL (ref 8.4–10.5)
Chloride: 102 mEq/L (ref 96–112)
Creatinine, Ser: 0.81 mg/dL (ref 0.40–1.20)
GFR: 83.69 mL/min (ref >60.00)
Glucose, Bld: 88 mg/dL (ref 70–99)
Potassium: 4.3 mEq/L (ref 3.5–5.1)
Sodium: 138 mEq/L (ref 135–145)
Total Bilirubin: 0.4 mg/dL (ref 0.2–1.2)
Total Protein: 6.7 g/dL (ref 6.0–8.3)

## 2021-11-08 LAB — TSH: TSH: 1.16 u[IU]/mL (ref 0.35–5.50)

## 2021-11-08 LAB — HEMOGLOBIN A1C: Hgb A1c MFr Bld: 5.6 % (ref 4.6–6.5)

## 2021-11-08 NOTE — Progress Notes (Signed)
BP 123/83    Pulse 86    Ht 5' 4.5" (1.638 m)    Wt 199 lb 6.4 oz (90.4 kg)    LMP 10/20/2021    BMI 33.70 kg/m    Subjective:    Patient ID: Kristen Evans, female    DOB: Aug 12, 1971, 51 y.o.   MRN: [id number]  HPI: Kristen Evans is a 51 y.o. female presenting on 11/08/2021 for comprehensive medical examination. Current medical complaints include: nipple discharge.   Nipple discharge: Started August 2021 as milky/yellow; last year started having some blood mixed in also, not always but frequently. Mammogram, [place] and biopsy last year (fibrotic changes with PASH), but did not explain the discharge. MRI breast was ordered, but she didn't do it because she wanted to talk to PCP. She does have occasional breast pain that comes and goes, blood seems to be more noticeable after the pain. Always left breast only. Enough d/c that she puts a disposable cotton pad in her bra. No vaginal bleeding, has not felt any breast masses/lumps. Mother had breast cancer.    She currently lives with: husband and sons Interim Problems from her last visit: no   She reports regular vision exams q1-5y: yes She reports regular dental exams q 39m yes Her diet consists of: regular She endorses exercise and/or activity of: walking a few days a week for an hour each time  She works at: [employer]  She denies ETOH use. She denies nictoine use. She denies illegal substance use.    She reports regular menstrual periods with moderate flow. Current menopausal symptoms: no She is currently  sexually active w husband She denies  concerns today about STI Contraception choices are: vasectomy   She denies concerns about skin changes today. She denies concerns about bowel changes today. She denies concerns about bladder changes today.   Depression Screen done today and results listed below:  [facility]  Decreased Interest 0  Down, Depressed, Hopeless 0  PHQ - 2 Score 0    She does not  have a history of falls.        Past Medical History:  Past Medical History:  Diagnosis Date   Allergy    seasonal    Anemia    with pregnancy   Fibrocystic breast disease    GERD (gastroesophageal reflux disease)    pregnancy and recently   Left ankle pain 03/04/2017   Lipoma of lower extremity 11/21/2011   Migraine    Migraines 11/21/2011   Obesity    Preventative health care 11/21/2011   Reflux 11/21/2011    Surgical History:  Past Surgical History:  Procedure Laterality Date   ANTERIOR AND POSTERIOR VAGINAL REPAIR     bilateral apical suspension  06/2020   BREAST SURGERY  1987   biopsy   dental implants     perineorrhaphy     WISDOM TOOTH EXTRACTION      Medications:  Current Outpatient Medications on File Prior to Visit  Medication Sig   aspirin-acetaminophen-caffeine (EXCEDRIN MIGRAINE) 250-250-65 MG tablet Take 1 tablet by mouth every 6 (six) hours as needed.   Cranberry (RA CRANBERRY) 500 MG CAPS Take by mouth.   famotidine (PEPCID) 20 MG tablet Take 20 mg by mouth daily as needed for heartburn or indigestion.   fluticasone (FLONASE) 50 MCG/ACT nasal spray    Lactobacillus Rhamnosus, GG, (RA PROBIOTIC DIGESTIVE CARE) CAPS Take 1 capsule by mouth every evening.   No current facility-administered  medications on file prior to visit.    Allergies:  Allergies  Allergen Reactions   Penicillins Hives    Happened in childhood, may have spread all over the body.    Social History:  Social History   Socioeconomic History   Marital status: Married    Spouse name: Not on file   Number of children: Not on file   Years of education: Not on file   Highest education level: Not on file  Occupational History   Not on file  Tobacco Use   Smoking status: Never   Smokeless tobacco: Never  Vaping Use   Vaping Use: Never used  Substance and Sexual Activity   Alcohol use: No   Drug use: No   Sexual activity: Yes  Other Topics Concern   Not on file  Social  History Narrative   Not on file   Social Determinants of Health   Financial Resource Strain: Not on file  Food Insecurity: Not on file  Transportation Needs: Not on file  Physical Activity: Not on file  Stress: Not on file  Social Connections: Not on file  Intimate Partner Violence: Not on file   Social History   Tobacco Use  Smoking Status Never  Smokeless Tobacco Never   Social History   Substance and Sexual Activity  Alcohol Use No    Family History:  Family History  Problem Relation Age of Onset   Cancer Mother        breast   Hypertension Mother    Breast cancer Mother    Colon polyps Mother    Hyperlipidemia Father    Other Father        polycythemia  vera   Heart disease Father        CAD s/p CABG x 4 vessels   Arthritis Maternal Grandmother    Cancer Maternal Grandmother        breast   Hyperlipidemia Maternal Grandmother    Stroke Maternal Grandmother    Breast cancer Maternal Grandmother    Heart disease Maternal Grandfather    Hyperlipidemia Maternal Grandfather    Stroke Maternal Grandfather    Heart disease Paternal Grandfather    GER disease Daughter    Colon cancer Neg Hx    Esophageal cancer Neg Hx    Rectal cancer Neg Hx    Stomach cancer Neg Hx     Past medical history, surgical history, medications, allergies, family history and social history reviewed with patient today and changes made to appropriate areas of the chart.   All ROS negative except what is listed above and in the HPI.      Objective:    BP 123/83    Pulse 86    Ht 5' 4.5" (1.638 m)    Wt 199 lb 6.4 oz (90.4 kg)    LMP 10/20/2021    BMI 33.70 kg/m   Wt Readings from Last 3 Encounters:  11/08/21 199 lb 6.4 oz (90.4 kg)  11/17/20 198 lb (89.8 kg)  11/03/20 198 lb (89.8 kg)    Physical Exam Vitals reviewed.  Constitutional:      Appearance: Normal appearance. She is obese.  HENT:     Head: Normocephalic and atraumatic.     Right Ear: Tympanic membrane normal.      Left Ear: Tympanic membrane normal.     Nose: Nose normal.     Mouth/Throat:     Mouth: Mucous membranes are moist.     Pharynx: Oropharynx is  clear.  Eyes:     Extraocular Movements: Extraocular movements intact.     Conjunctiva/sclera: Conjunctivae normal.     Pupils: Pupils are equal, round, and reactive to light.  Cardiovascular:     Rate and Rhythm: Normal rate and regular rhythm.     Pulses: Normal pulses.     Heart sounds: Normal heart sounds.  Pulmonary:     Effort: Pulmonary effort is normal.     Breath sounds: Normal breath sounds.  Abdominal:     General: Abdomen is flat. Bowel sounds are normal. There is no distension.     Palpations: Abdomen is soft. There is no mass.     Tenderness: There is no abdominal tenderness. There is no guarding or rebound.  Genitourinary:    General: Normal vulva.     Vagina: No vaginal discharge.     Cervix: Normal.     Uterus: Normal.      Adnexa: Right adnexa normal and left adnexa normal.  Musculoskeletal:        General: Normal range of motion.     Cervical back: Normal range of motion and neck supple. No tenderness.  Lymphadenopathy:     Cervical: No cervical adenopathy.  Skin:    General: Skin is warm and dry.     Capillary Refill: Capillary refill takes less than 2 seconds.  Neurological:     General: No focal deficit present.     Mental Status: She is alert and oriented to person, place, and time. Mental status is at baseline.  Psychiatric:        Mood and Affect: Mood normal.        Behavior: Behavior normal.        Thought Content: Thought content normal.        Judgment: Judgment normal.    Results for orders placed or performed in visit on 04/04/21  HM MAMMOGRAPHY  Result Value Ref Range   HM Mammogram 0-4 Bi-Rad 0-4 Bi-Rad, Self Reported Normal      Assessment & Plan:   Problem List Items Addressed This Visit       Other   Obesity   Relevant Orders   Comprehensive metabolic panel   Lipid panel   TSH    Hemoglobin A1c   Discharge from breast   Relevant Orders   CBC   Hyperlipidemia   Relevant Orders   Comprehensive metabolic panel   Lipid panel   Hemoglobin A1c   Other Visit Diagnoses     Encounter for routine adult health examination with abnormal findings    -  Primary   Relevant Orders   CBC   Comprehensive metabolic panel   Lipid panel   TSH   HIV Antibody (routine testing w rflx)   Hemoglobin A1c   Screening for cervical cancer       Relevant Orders   Cytology - PAP( [place])       Recommended she proceed with breast MRI given persistent symptoms. She will let us know if she needs a new order.    LABORATORY TESTING:  - Health maintenance labs ordered today as discussed above.           CBC, CMP, LIPIDS          TSH - high risk or symptoms          A1c - hx, high risk, symptomatic  - STI testing: deferred - Pap smear: done today - HIV screening: today    IMMUNIZATIONS:   -  Tdap: Tetanus vaccination status reviewed: last tetanus booster within 10 years. - Influenza: Up to date - Pneumovax: Not applicable - Prevnar: Not applicable - HPV: Not applicable - Shingrix vaccine: Given elsewhere - COVID-19: Given elsewhere  SCREENING: - Mammogram: Up to date - Bone Density: Not applicable - Colonoscopy: Up to date  Discussed with patient purpose of the colonoscopy is to detect colon cancer at curable precancerous or early stages  - AAA Screening: Not applicable  -Hearing Test: Not applicable  -Spirometry: Not applicable  - Lung Cancer Screening: Not applicable    PATIENT COUNSELING:   Advised to take 1 mg of folate supplement per day if capable of pregnancy.   Sexuality: Discussed sexually transmitted diseases, partner selection, use of condoms, avoidance of unintended pregnancy, and contraceptive alternatives.    I discussed with the patient that most people either abstain from alcohol or drink within safe limits (<=14/week and <=4 drinks/occasion for  males, <=7/weeks and <= 3 drinks/occasion for females) and that the risk for alcohol disorders and other health effects rises proportionally with the number of drinks per week and how often a drinker exceeds daily limits.  Discussed cessation/primary prevention of drug use and availability of treatment for abuse.   Diet: Encouraged to adjust caloric intake to maintain or achieve ideal body weight, to reduce intake of dietary saturated fat and total fat, to limit sodium intake by avoiding high sodium foods and not adding table salt, and to maintain adequate dietary potassium and calcium preferably from fresh fruits, vegetables, and low-fat dairy products. Encouraged vitamin D 1000 units and Calcium '1300mg'$  or 4 servings of dairy a day.  Emphasized the importance of regular exercise.  Injury prevention: Discussed safety belts, safety helmets, smoke detector, smoking near bedding or upholstery.   Dental health: Discussed importance of regular tooth brushing, flossing, and dental visits.  Follow up plan:   Return in about 1 year (around 11/09/2022) for physical; pending MRI sooner if needed .   [facility] [employer] Olevia Bowens, DNP, FNP-C

## 2021-11-08 NOTE — Patient Instructions (Signed)
Call [facility] to schedule your MRI Breast. If you need a new order, let us know.  ?

## 2021-11-09 LAB — HIV ANTIBODY (ROUTINE TESTING W REFLEX): HIV 1&2 Ab, 4th Generation: NONREACTIVE

## 2021-11-09 NOTE — Progress Notes (Signed)
-  Cholesterol is still elevated - continue working on heart healthy diet, exercise, weight management, add an Omega-3 supplement if you aren't already taking one. ?-All other labs look great! ? ? ? ?The 10-year ASCVD risk score (Arnett DK, et al., 2019) is: 1.6% ?  Values used to calculate the score: ?    Age: 51 years ?    Sex: Female ?    Is Non-Hispanic African American: No ?    Diabetic: No ?    Tobacco smoker: No ?    Systolic Blood Pressure: 848 mmHg ?    Is BP treated: No ?    HDL Cholesterol: 61.1 mg/dL ?    Total Cholesterol: 245 mg/dL ?

## 2021-11-13 LAB — CYTOLOGY - PAP
Comment: NEGATIVE
Diagnosis: NEGATIVE
Diagnosis: REACTIVE
High risk HPV: NEGATIVE

## 2021-11-17 ENCOUNTER — Encounter: Payer: Self-pay | Admitting: Family Medicine

## 2021-11-17 DIAGNOSIS — N6452 Nipple discharge: Secondary | ICD-10-CM

## 2021-11-25 ENCOUNTER — Other Ambulatory Visit (HOSPITAL_BASED_OUTPATIENT_CLINIC_OR_DEPARTMENT_OTHER): Payer: 59

## 2021-12-07 NOTE — Telephone Encounter (Signed)
Pt stated she still has not received a call for mri. Only referral I can see is for [facility], but pt stated they are unable to do this. Please advise.  ?

## 2021-12-12 ENCOUNTER — Encounter: Payer: Self-pay | Admitting: Family Medicine

## 2021-12-12 MED ORDER — ALBENDAZOLE 200 MG PO TABS: ORAL_TABLET | ORAL | 0 refills | Status: DC

## 2022-01-11 ENCOUNTER — Ambulatory Visit
Admission: RE | Admit: 2022-01-11 | Discharge: 2022-01-11 | Disposition: A | Discharge status: Home / Self Care | Payer: Commercial Managed Care - PPO | Source: Ambulatory Visit | Attending: Family Medicine | Admitting: Family Medicine

## 2022-01-11 DIAGNOSIS — N6452 Nipple discharge: Secondary | ICD-10-CM

## 2022-01-11 MED ORDER — GADOBUTROL 1 MMOL/ML IV SOLN
10.0000 mL | Freq: Once | INTRAVENOUS | Status: AC | PRN
  Administered 2022-01-11: 10 mL via INTRAVENOUS

## 2022-03-26 LAB — HM MAMMOGRAPHY

## 2022-07-20 ENCOUNTER — Encounter: Payer: Self-pay | Admitting: Family Medicine

## 2022-07-20 ENCOUNTER — Ambulatory Visit (INDEPENDENT_AMBULATORY_CARE_PROVIDER_SITE_OTHER): Payer: Commercial Managed Care - PPO | Admitting: Family Medicine

## 2022-07-20 VITALS — BP 134/72 | HR 76 | Ht 64.5 in | Wt 208.6 lb

## 2022-07-20 DIAGNOSIS — R519 Headache, unspecified: Secondary | ICD-10-CM | POA: Diagnosis not present

## 2022-07-20 MED ORDER — PREDNISONE 20 MG PO TABS: 40.0000 mg | ORAL_TABLET | Freq: Every day | ORAL | 0 refills | Status: AC

## 2022-07-20 MED ORDER — DOXYCYCLINE HYCLATE 100 MG PO TABS: 100.0000 mg | ORAL_TABLET | Freq: Two times a day (BID) | ORAL | 0 refills | Status: AC

## 2022-07-20 NOTE — Progress Notes (Signed)
Acute Office Visit  Subjective:     Patient ID: JAMARIYAH JOHANNSEN, female    DOB: 04/27/1971, 51 y.o.   MRN: [id number]  CC: headaches, sinus pain   Headache  This is a recurrent problem. The current episode started 1 to 4 weeks ago. Episode frequency: 3-4 days per week. The problem has been waxing and waning. The pain is located in the Bilateral, occipital and frontal (tends to be worse on the left side) region. The pain does not radiate. The pain quality is not similar to prior headaches. The quality of the pain is described as dull. The pain is at a severity of 6/10. Associated symptoms include drainage and sinus pressure. Pertinent negatives include no back pain, blurred vision, coughing, dizziness, ear pain, eye pain, fever, hearing loss, insomnia, muscle aches, nausea, neck pain, numbness, phonophobia, photophobia, rhinorrhea, sore throat, tinnitus, visual change, vomiting, weakness or weight loss. Nothing aggravates the symptoms. She has tried NSAIDs and Excedrin for the symptoms. The treatment provided no relief. Her past medical history is significant for migraine headaches and obesity. There is no history of hypertension or recent head traumas.  Reports these headaches have not had any of her typical migraine features (nausea/vomiting, photo/phonophobia, etc). Reports sometimes she wakes up with the discomfort and other times it will develop later in the day, but usually will last most of the day once it starts. She is also having some left sided dental discomfort, but unsure if related (she sees dentist regularly).   All review of systems negative except what is listed in the HPI      Objective:    BP 134/72   Pulse 76   Ht 5' 4.5" (1.638 m)   Wt 208 lb 9.6 oz (94.6 kg)   BMI 35.25 kg/m    Physical Exam Vitals reviewed.  Constitutional:      Appearance: Normal appearance. She is obese.  HENT:     Right Ear: A middle ear effusion is present.     Left Ear: A middle ear  effusion is present.     Nose: Nose normal.     Mouth/Throat:     Comments: Postnasal drainage noted Eyes:     Extraocular Movements: Extraocular movements intact.     Conjunctiva/sclera: Conjunctivae normal.     Pupils: Pupils are equal, round, and reactive to light.  Cardiovascular:     Rate and Rhythm: Normal rate and regular rhythm.  Pulmonary:     Effort: Pulmonary effort is normal.     Breath sounds: Normal breath sounds.  Musculoskeletal:        General: No tenderness or deformity. Normal range of motion.     Cervical back: Normal range of motion and neck supple. No tenderness.  Lymphadenopathy:     Cervical: No cervical adenopathy.  Skin:    General: Skin is warm and dry.  Neurological:     General: No focal deficit present.     Mental Status: She is alert and oriented to person, place, and time. Mental status is at baseline.     Cranial Nerves: No cranial nerve deficit.     Sensory: No sensory deficit.     Motor: No weakness.     Coordination: Coordination normal.     Gait: Gait normal.  Psychiatric:        Mood and Affect: Mood normal.        Behavior: Behavior normal.        Thought Content: Thought content normal.  Judgment: Judgment normal.     No results found for any visits on 07/20/22.      Assessment & Plan:   Problem List Items Addressed This Visit   None Visit Diagnoses     Sinus headache    -  Primary Uncertain HA etiology at this time, but no red flags on neuro exam. Since you have had significant sinus pressure and post nasal drainage along with some dental pain, let's try treating like a sinus infection with antibiotics and prednisone and see if the headaches improve. Please monitor for any new symptoms and let us know so we can adjust plan if necessary. For any severe symptoms, go to the emergency department.     Relevant Medications   doxycycline (VIBRA-TABS) 100 MG tablet   predniSONE (DELTASONE) 20 MG tablet       Meds ordered  this encounter  Medications   doxycycline (VIBRA-TABS) 100 MG tablet    Sig: Take 1 tablet (100 mg total) by mouth 2 (two) times daily for 7 days.    Dispense:  14 tablet    Refill:  0    Order Specific Question:   Supervising Provider    Answer:   Penni Homans A [[id number]]   predniSONE (DELTASONE) 20 MG tablet    Sig: Take 2 tablets (40 mg total) by mouth daily with breakfast for 5 days.    Dispense:  10 tablet    Refill:  0    Order Specific Question:   Supervising Provider    Answer:   Penni Homans A [[id number]]    Return if symptoms worsen or fail to improve.  Terrilyn Saver, NP

## 2022-07-20 NOTE — Patient Instructions (Signed)
Uncertain etiology at this time, but no red flags on neuro exam. Since you have had significant sinus pressure and post nasal drainage, let's try treating like a sinus infection with antibiotics and prednisone and see if the headaches improve. Please monitor for any new symptoms and let us know so we can adjust plan if necessary. For any severe symptoms, go to the emergency department.

## 2022-10-22 ENCOUNTER — Encounter: Payer: Self-pay | Admitting: Family Medicine

## 2022-10-22 ENCOUNTER — Telehealth (INDEPENDENT_AMBULATORY_CARE_PROVIDER_SITE_OTHER): Payer: Commercial Managed Care - PPO | Admitting: Family Medicine

## 2022-10-22 DIAGNOSIS — R200 Anesthesia of skin: Secondary | ICD-10-CM | POA: Diagnosis not present

## 2022-10-22 DIAGNOSIS — R202 Paresthesia of skin: Secondary | ICD-10-CM

## 2022-10-22 NOTE — Progress Notes (Signed)
Virtual Video Visit via MyChart Note  I connected with  Kristen Evans on 10/22/22 at 11:00 AM EST by the video enabled telemedicine application for MyChart, and verified that I am speaking with the correct person using two identifiers.   I introduced myself as a Designer, jewellery with the practice. We discussed the limitations of evaluation and management by telemedicine and the availability of in person appointments. The patient expressed understanding and agreed to proceed.  Participating parties in this visit include: The patient and the nurse practitioner listed.  The patient is: At home I am: In the office - [facility]  Subjective:    CC: hands falling asleep   HPI: Kristen Evans is a 52 y.o. year old female presenting today via [place] today for hand numbness and tingling.  Patient reports she has been having issues with her hands falling asleep, especially during the night, for the past year or so. Symptoms seem to be gradually worsening. States her right hand symptoms are worse than left - she is right handed. Numbness/tingling tends to primarily affect the 3rd-5th fingers. Reports that she is starting to have some daytime symptoms as well with certain activities like driving, applying makeup, etc. 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 She has tried keep her wrists straight, but hasn't felt any improvement unless she keeps her elbows straight. States she will have some occasional forearm discomfort as well. She has not had any neck/shoulder pain/injury, extremity temperature or color changes, swelling. States she has never had this worked up before.     Past medical history, Surgical history, Family history not pertinant except as noted below, Social history, Allergies, and medications have been entered into the medical record, reviewed, and corrections made.   Review of  Systems:  All review of systems negative except what is listed in the HPI   Objective:    General:  Speaking clearly in complete sentences. Absent shortness of breath noted.   Alert and oriented x3.   Normal judgment.  Absent acute distress. Exam limited due to virtual visit    Impression and Recommendations:    1. Numbness and tingling in both hands - DG Cervical Spine Complete; Future - Ambulatory referral to Orthopedic Surgery Given bilateral symptoms (although right worse than left) of numbness/tingling into 3rd-5th fingers, start with cervical spine xray. Ortho referral placed to further evaluate and manage. Discussed trial of wrist braces when sleeping or NSAID. Patient aware of signs/symptoms requiring further/urgent evaluation.     Follow-up if symptoms worsen or fail to improve.    I discussed the assessment and treatment plan with the patient. The patient was provided an opportunity to ask questions and all were answered. The patient agreed with the plan and demonstrated an understanding of the instructions.   The patient was advised to call back or seek an in-person evaluation if the symptoms worsen or if the condition fails to improve as anticipated.    Terrilyn Saver, NP

## 2022-10-23 ENCOUNTER — Ambulatory Visit (HOSPITAL_BASED_OUTPATIENT_CLINIC_OR_DEPARTMENT_OTHER)
Admission: RE | Admit: 2022-10-23 | Discharge: 2022-10-23 | Disposition: A | Discharge status: Home / Self Care | Payer: Commercial Managed Care - PPO | Source: Ambulatory Visit | Attending: Family Medicine | Admitting: Family Medicine

## 2022-10-23 DIAGNOSIS — R200 Anesthesia of skin: Secondary | ICD-10-CM | POA: Insufficient documentation

## 2022-10-23 DIAGNOSIS — R202 Paresthesia of skin: Secondary | ICD-10-CM | POA: Diagnosis present

## 2022-10-25 MED ORDER — CYCLOBENZAPRINE HCL 5 MG PO TABS: 5.0000 mg | ORAL_TABLET | Freq: Three times a day (TID) | ORAL | 1 refills | Status: DC | PRN

## 2022-10-25 NOTE — Addendum Note (Signed)
Addended by: Terrilyn Saver on: 10/25/2022 05:03 PM   Modules accepted: Orders

## 2022-10-29 ENCOUNTER — Ambulatory Visit: Payer: Self-pay

## 2022-10-29 ENCOUNTER — Encounter: Payer: Self-pay | Admitting: Sports Medicine

## 2022-10-29 ENCOUNTER — Ambulatory Visit (INDEPENDENT_AMBULATORY_CARE_PROVIDER_SITE_OTHER): Payer: Commercial Managed Care - PPO | Admitting: Sports Medicine

## 2022-10-29 DIAGNOSIS — R200 Anesthesia of skin: Secondary | ICD-10-CM | POA: Diagnosis not present

## 2022-10-29 DIAGNOSIS — G5611 Other lesions of median nerve, right upper limb: Secondary | ICD-10-CM | POA: Diagnosis not present

## 2022-10-29 DIAGNOSIS — G5621 Lesion of ulnar nerve, right upper limb: Secondary | ICD-10-CM

## 2022-10-29 DIAGNOSIS — R202 Paresthesia of skin: Secondary | ICD-10-CM | POA: Diagnosis not present

## 2022-10-29 MED ORDER — METHYLPREDNISOLONE 4 MG PO TBPK: ORAL_TABLET | ORAL | 0 refills | Status: DC

## 2022-10-29 NOTE — Progress Notes (Signed)
Kristen Evans - 52 y.o. female MRN [id number]  Date of birth: May 31, 1971  Office Visit Note: Visit Date: 10/29/2022 PCP: Mosie Lukes, MD Referred by: Terrilyn Saver, NP  Subjective: Chief Complaint  Patient presents with   Right Wrist - Numbness   Left Wrist - Numbness   HPI: Kristen Evans is a pleasant 52 y.o. female who presents today for bilateral hand numbness and tingling, R > L.  Mileena has had on and off bilateral hand numbness and tingling for the last year or more.  This has not always been present.  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  About 2 weeks ago she was doing this and had a flareup of her symptoms, right worse than left.  She will get numbness and tingling that is always in the third finger, other times it is in the first and second finger and other times in the fourth and fifth finger.  Her symptoms are always worse at nighttime, occasionally will wake her up.  It bothers her during other activities such as driving, applying make-up, counting/computer work.  When her pain is flared up she does note some weakness with grip strength.  She denies any swelling, redness or color change to the hands.  Denies any current neck issues.  At times the numbness and tingling sensation will radiate up into the forearms, but does not come from the neck or shoulders per her report.  She does sleep with her elbows flexed.  Feels like the pain is better when she keeps the elbows and wrist straight/neutral.  Pertinent ROS were reviewed with the patient and found to be negative unless otherwise specified above in HPI.   Assessment & Plan: Visit Diagnoses:  1. Numbness and tingling in both hands   2. Median nerve dysfunction, right   3. Lesion of right ulnar nerve    Plan: Discussed with Clessie the possible etiology of her numbness and tingling in her hands, right is certainly worse than the left.  She has  ultrasound findings on the right side that are consistent with concomitant enlargement of the median nerve (CTS) as well as possible ulnar nerve subluxation/cubital tunnel.  Her numbness and tingling is in all 5 digits at times, so difficult to ascertain which nerve entrapment/dysfunction is most prominent.  We will do a 6-day methylprednisolone taper to help relieve her inflammation and calm down her symptoms.  I did print out and show her cock-up wrist braces to wear at nighttime to improve her symptoms.  Also discussed conservative measures for cubital tunnel/ulnar nerve entrapment with sleeping with her arm straight in a pillowcase.  We will see how she does with the medicine and use conservative measures of the next 3-4 weeks currently present for reevaluation.  If her symptoms or not improved or are somehow worsening, next step would be consideration of EMG/nerve conduction studies.  I am less suspicious that this is because of cervical radiculopathy.  Follow-up: Return in about 4 weeks (around 11/26/2022) for 3-4 weeks for bilateral n/t.   Meds & Orders:  Meds ordered this encounter  Medications   methylPREDNISolone (MEDROL DOSEPAK) 4 MG TBPK tablet    Sig: Take per packet instructions (taper)    Dispense:  1 each    Refill:  0    Orders Placed This Encounter  Procedures   [place] Extrem Up Right Ltd  Procedures: No procedures performed      Clinical History: No specialty comments available.  She reports that she has never smoked. She has never used smokeless tobacco.  Recent Labs    11/08/21 1042  HGBA1C 5.6   Lab Results  Component Value Date   TSH 1.16 11/08/2021    Objective:    Physical Exam  Gen: Well-appearing, in no acute distress; non-toxic CV: Well-perfused. Warm.  Resp: Breathing unlabored on room air; no wheezing. Psych: Fluid speech in conversation; appropriate affect; normal thought process Neuro: Sensation intact throughout. No gross coordination deficits.    Ortho Exam - Bilateral hands/wrists: The right hand has some mild pallor changes compared to the left.  There is negative Tinel's sign at the cubital tunnel, carpal tunnel and Guyon's canal.  Positive flick test.  Negative Phalen's and reverse Phalen's test.  Strength 5/5 at the wrist, fingers and abduction/abduction. NVI.  - Right elbow: Negative Tinel's over the ulnar groove and cubital tunnel.  Full range of motion with flexion and extension, there is some mild subluxation of the ulnar nerve with endrange flexion.  Imaging: [place] Extrem Up Right Ltd  Result Date: 10/29/2022 Limited musculoskeletal ultrasound of the right upper extremity was performed.  The carpal tunnel inlet and outlet was identified.  The median nerve was seen with mild welling of the nerve.  There is no significant hyperemia noted or dynamic entrapment.  There is enlargement of the median nerve with a CSA of approximately 0.12 cm.  Evaluation of the ulnar nerve near the elbow cubital tunnel shows mild subluxation of the nerve out of the ulnar groove that goes about 50-60% out of the bony groove.  There is a mild snapping sensation with dynamic testing today.  This is not subluxed completely out of the groove however.   *Independent review of complete cervical spine x-rays from 10/23/2022 was reviewed and interpreted by myself.  There is mild flattening of the normal cervical lordosis.  Mild facet hypertrophy most notable at C5 and C6.  No significant loss of disc height or significant spondylosis.  [place] Extrem Up Right Ltd Limited musculoskeletal ultrasound of the right upper extremity was  performed.  The carpal tunnel inlet and outlet was identified.  The median  nerve was seen with mild welling of the nerve.  There is no significant  hyperemia noted or dynamic entrapment.  There is enlargement of the median  nerve with a CSA of approximately 0.12 cm.  Evaluation of the ulnar nerve  near the elbow cubital tunnel shows mild  subluxation of the nerve out of  the ulnar groove that goes about 50-60% out of the bony groove.  There is  a mild snapping sensation with dynamic testing today.  This is not  subluxed completely out of the groove however.    Past Medical/Family/Surgical/Social History: Medications & Allergies reviewed per EMR, new medications updated. Patient Active Problem List   Diagnosis Date Noted   Discharge from breast 08/30/2020   Hyperlipidemia 08/30/2020   Allergies 08/30/2020   UTI (urinary tract infection) 03/17/2020   Pelvic floor dysfunction in female 03/17/2020   Easy bruising 03/04/2017   Left ankle pain 03/04/2017   Patellofemoral pain syndrome 06/02/2014   left thigh masses 12/17/2011   Lipoma of lower extremity 11/21/2011   Acid reflux 11/21/2011   Migraines 11/21/2011   Preventative health care 11/21/2011   Obesity    Fibrocystic breast disease    Past Medical History:  Diagnosis Date   Allergy  seasonal    Anemia    with pregnancy   Fibrocystic breast disease    GERD (gastroesophageal reflux disease)    pregnancy and recently   Left ankle pain 03/04/2017   Lipoma of lower extremity 11/21/2011   Migraine    Migraines 11/21/2011   Obesity    Preventative health care 11/21/2011   Reflux 11/21/2011   Family History  Problem Relation Age of Onset   Cancer Mother        breast   Hypertension Mother    Breast cancer Mother    Colon polyps Mother    Hyperlipidemia Father    Other Father        polycythemia  vera   Heart disease Father        CAD s/p CABG x 4 vessels   Arthritis Maternal Grandmother    Cancer Maternal Grandmother        breast   Hyperlipidemia Maternal Grandmother    Stroke Maternal Grandmother    Breast cancer Maternal Grandmother    Heart disease Maternal Grandfather    Hyperlipidemia Maternal Grandfather    Stroke Maternal Grandfather    Heart disease Paternal Grandfather    GER disease Daughter    Colon cancer Neg Hx    Esophageal cancer  Neg Hx    Rectal cancer Neg Hx    Stomach cancer Neg Hx    Past Surgical History:  Procedure Laterality Date   ANTERIOR AND POSTERIOR VAGINAL REPAIR     bilateral apical suspension  06/2020   BREAST SURGERY  1987   biopsy   dental implants     perineorrhaphy     WISDOM TOOTH EXTRACTION     Social History   Occupational History   Not on file  Tobacco Use   Smoking status: Never   Smokeless tobacco: Never  Vaping Use   Vaping Use: Never used  Substance and Sexual Activity   Alcohol use: No   Drug use: No   Sexual activity: Yes

## 2022-10-29 NOTE — Progress Notes (Signed)
Bilateral hand numbness/tingling Right> left  Keeping her up at night States it has worsened over the last 2 weeks   Denies any medication for it Xrays of neck were just done recently through her PCP

## 2022-11-12 ENCOUNTER — Encounter: Payer: 59 | Admitting: Family Medicine

## 2022-11-18 NOTE — Assessment & Plan Note (Signed)
Encouraged heart healthy diet such as MIND or DASH diet, increase exercise, avoid trans fats, consider a krill or fish oil cap daily

## 2022-11-18 NOTE — Assessment & Plan Note (Signed)
Patient encouraged to maintain heart healthy diet, regular exercise, adequate sleep. Consider daily probiotics. Take medications as prescribed. Labs reviewed and ordered. 11/2020 colonoscopy repeat in 10 years. Pap in 2023 repeat in 3-5 years MGM 03/2022 repeat in 2024

## 2022-11-19 ENCOUNTER — Ambulatory Visit (INDEPENDENT_AMBULATORY_CARE_PROVIDER_SITE_OTHER): Payer: Commercial Managed Care - PPO | Admitting: Family Medicine

## 2022-11-19 ENCOUNTER — Encounter: Payer: Self-pay | Admitting: Family Medicine

## 2022-11-19 VITALS — BP 128/82 | HR 84 | Temp 97.5°F | Resp 16 | Ht 64.0 in | Wt 217.4 lb

## 2022-11-19 DIAGNOSIS — R739 Hyperglycemia, unspecified: Secondary | ICD-10-CM | POA: Diagnosis not present

## 2022-11-19 DIAGNOSIS — L299 Pruritus, unspecified: Secondary | ICD-10-CM

## 2022-11-19 DIAGNOSIS — E669 Obesity, unspecified: Secondary | ICD-10-CM | POA: Diagnosis not present

## 2022-11-19 DIAGNOSIS — F40243 Fear of flying: Secondary | ICD-10-CM | POA: Diagnosis not present

## 2022-11-19 DIAGNOSIS — Z0000 Encounter for general adult medical examination without abnormal findings: Secondary | ICD-10-CM | POA: Diagnosis not present

## 2022-11-19 DIAGNOSIS — T7840XA Allergy, unspecified, initial encounter: Secondary | ICD-10-CM | POA: Diagnosis not present

## 2022-11-19 DIAGNOSIS — Z6833 Body mass index (BMI) 33.0-33.9, adult: Secondary | ICD-10-CM

## 2022-11-19 DIAGNOSIS — E785 Hyperlipidemia, unspecified: Secondary | ICD-10-CM

## 2022-11-19 LAB — COMPREHENSIVE METABOLIC PANEL
ALT: 18 U/L (ref 0–35)
AST: 15 U/L (ref 0–37)
Albumin: 4 g/dL (ref 3.5–5.2)
Alkaline Phosphatase: 56 U/L (ref 39–117)
BUN: 10 mg/dL (ref 6–23)
CO2: 28 mEq/L (ref 19–32)
Calcium: 9.3 mg/dL (ref 8.4–10.5)
Chloride: 102 mEq/L (ref 96–112)
Creatinine, Ser: 0.77 mg/dL (ref 0.40–1.20)
GFR: 88.29 mL/min (ref >60.00)
Glucose, Bld: 86 mg/dL (ref 70–99)
Potassium: 4.5 mEq/L (ref 3.5–5.1)
Sodium: 138 mEq/L (ref 135–145)
Total Bilirubin: 0.5 mg/dL (ref 0.2–1.2)
Total Protein: 6.5 g/dL (ref 6.0–8.3)

## 2022-11-19 LAB — CBC WITH DIFFERENTIAL/PLATELET
Basophils Absolute: 0 10*3/uL (ref 0.0–0.1)
Basophils Relative: 0.6 % (ref 0.0–3.0)
Eosinophils Absolute: 0.1 10*3/uL (ref 0.0–0.7)
Eosinophils Relative: 2.2 % (ref 0.0–5.0)
HCT: 39.4 % (ref 36.0–46.0)
Hemoglobin: 13.1 g/dL (ref 12.0–15.0)
Lymphocytes Relative: 22.9 % (ref 12.0–46.0)
Lymphs Abs: 1.4 10*3/uL (ref 0.7–4.0)
MCHC: 33.3 g/dL (ref 30.0–36.0)
MCV: 89 fl (ref 78.0–100.0)
Monocytes Absolute: 0.3 10*3/uL (ref 0.1–1.0)
Monocytes Relative: 5.3 % (ref 3.0–12.0)
Neutro Abs: 4.3 10*3/uL (ref 1.4–7.7)
Neutrophils Relative %: 69 % (ref 43.0–77.0)
Platelets: 222 10*3/uL (ref 150.0–400.0)
RBC: 4.42 Mil/uL (ref 3.87–5.11)
RDW: 14.4 % (ref 11.5–15.5)
WBC: 6.3 10*3/uL (ref 4.0–10.5)

## 2022-11-19 LAB — LIPID PANEL
Cholesterol: 262 mg/dL — ABNORMAL HIGH (ref 0–200)
HDL: 61.5 mg/dL (ref >39.00)
LDL Cholesterol: 168 mg/dL — ABNORMAL HIGH (ref 0–99)
NonHDL: 200.03
Total CHOL/HDL Ratio: 4
Triglycerides: 158 mg/dL — ABNORMAL HIGH (ref 0.0–149.0)
VLDL: 31.6 mg/dL (ref 0.0–40.0)

## 2022-11-19 LAB — HEMOGLOBIN A1C: Hgb A1c MFr Bld: 5.7 % (ref 4.6–6.5)

## 2022-11-19 LAB — TSH: TSH: 1.31 u[IU]/mL (ref 0.35–5.50)

## 2022-11-19 MED ORDER — ALPRAZOLAM 0.25 MG PO TABS: 0.2500 mg | ORAL_TABLET | Freq: Two times a day (BID) | ORAL | 0 refills | Status: DC | PRN

## 2022-11-19 NOTE — Assessment & Plan Note (Addendum)
Encouraged DASH or MIND diet, decrease po intake and increase exercise as tolerated. Needs 7-8 hours of sleep nightly. Avoid trans fats, eat small, frequent meals every 4-5 hours with lean proteins, complex carbs and healthy fats. Minimize simple carbs, high fat foods and processed foods she is motivated to loose weight at this time discussed medications vs lifestyle changes and she agreed to a referral to [place] to evaluate and treat.

## 2022-11-19 NOTE — Assessment & Plan Note (Signed)
Given Alprazolam to use prn

## 2022-11-19 NOTE — Assessment & Plan Note (Signed)
Intermittent try Witch Hazel Astringent prn

## 2022-11-19 NOTE — Assessment & Plan Note (Signed)
Uses flonase and cetirizine daily and feels they help some but still struggles with daily PND. She can try increasing Cetirizine to twice daily or switching to Zyxal or Allegra.

## 2022-11-19 NOTE — Progress Notes (Signed)
Subjective:    Patient ID: Kristen Evans, female    DOB: 01/01/1971, 52 y.o.   MRN: [id number]  Chief Complaint  Patient presents with   Annual Exam    Annual Exam    HPI Patient is in today for follow up on chronic medical concerns and annual preventative exam. No recent febrile illness or hospitalizations. Denies CP/palp/SOB/HA/congestion/fevers/GI or GU c/o. Taking meds as prescribed. She is frustrated with her difficulty losing weight, has tried weight watchers in the past with some weight loss but then the weight has returned. She tries to hydrate well and get adequate sleep. She notes on occasion her breasts will itch and it may correlate with onset of menses. No rash or discharge noted. Finally she is requesting a small prescription for Alprazolam to help her manage her fear of flying as [place] is [place].  Past Medical History:  Diagnosis Date   Allergy    seasonal    Anemia    with pregnancy   Fibrocystic breast disease    GERD (gastroesophageal reflux disease)    pregnancy and recently   Left ankle pain 03/04/2017   Lipoma of lower extremity 11/21/2011   Migraine    Migraines 11/21/2011   Obesity    Preventative health care 11/21/2011   Reflux 11/21/2011    Past Surgical History:  Procedure Laterality Date   ANTERIOR AND POSTERIOR VAGINAL REPAIR     bilateral apical suspension  06/2020   BREAST SURGERY  1987   biopsy   dental implants     perineorrhaphy     WISDOM TOOTH EXTRACTION      Family History  Problem Relation Age of Onset   Cancer Mother        breast   Hypertension Mother    Breast cancer Mother    Colon polyps Mother    Diabetes Father    Hyperlipidemia Father    Other Father        polycythemia  vera   Heart disease Father        CAD s/p CABG x 4 vessels   GER disease Daughter    Arthritis Maternal Grandmother    Cancer Maternal Grandmother        breast   Hyperlipidemia Maternal Grandmother    Stroke Maternal Grandmother     Breast cancer Maternal Grandmother    Heart disease Maternal Grandfather    Hyperlipidemia Maternal Grandfather    Stroke Maternal Grandfather    Heart disease Paternal Grandfather    Colon cancer Neg Hx    Esophageal cancer Neg Hx    Rectal cancer Neg Hx    Stomach cancer Neg Hx     Social History   Socioeconomic History   Marital status: Married    Spouse name: Not on file   Number of children: Not on file   Years of education: Not on file   Highest education level: Not on file  Occupational History   Not on file  Tobacco Use   Smoking status: Never   Smokeless tobacco: Never  Vaping Use   Vaping Use: Never used  Substance and Sexual Activity   Alcohol use: No   Drug use: No   Sexual activity: Yes  Other Topics Concern   Not on file  Social History Narrative   Not on file   Social Determinants of Health   Financial Resource Strain: Not on file  Food Insecurity: Not on file  Transportation Needs: Not on file  Physical Activity: Not on file  Stress: Not on file  Social Connections: Not on file  Intimate Partner Violence: Not on file    Outpatient Medications Prior to Visit  Medication Sig Dispense Refill   aspirin-acetaminophen-caffeine (EXCEDRIN MIGRAINE) 250-250-65 MG tablet Take 1 tablet by mouth every 6 (six) hours as needed.     Cranberry (RA CRANBERRY) 500 MG CAPS Take by mouth.     famotidine (PEPCID) 20 MG tablet Take 20 mg by mouth daily as needed for heartburn or indigestion.     fluticasone (FLONASE) 50 MCG/ACT nasal spray      cyclobenzaprine (FLEXERIL) 5 MG tablet Take 1 tablet (5 mg total) by mouth 3 (three) times daily as needed for muscle spasms. 30 tablet 1   methylPREDNISolone (MEDROL DOSEPAK) 4 MG TBPK tablet Take per packet instructions (taper) 1 each 0   No facility-administered medications prior to visit.    Allergies  Allergen Reactions   Penicillins Hives    Happened in childhood, may have spread all over the body.    Review  of Systems  Constitutional:  Positive for malaise/fatigue. Negative for chills and fever.  HENT:  Positive for congestion. Negative for hearing loss and sore throat.   Eyes:  Negative for discharge.  Respiratory:  Negative for cough, sputum production and shortness of breath.   Cardiovascular:  Negative for chest pain, palpitations and leg swelling.  Gastrointestinal:  Negative for abdominal pain, blood in stool, constipation, diarrhea, heartburn, nausea and vomiting.  Genitourinary:  Negative for dysuria, frequency, hematuria and urgency.  Musculoskeletal:  Negative for back pain, falls and myalgias.  Skin:  Positive for itching. Negative for rash.  Neurological:  Negative for dizziness, sensory change, loss of consciousness, weakness and headaches.  Endo/Heme/Allergies:  Negative for environmental allergies. Does not bruise/bleed easily.  Psychiatric/Behavioral:  Negative for depression and suicidal ideas. The patient is not nervous/anxious and does not have insomnia.        Objective:    Physical Exam Constitutional:      General: She is not in acute distress.    Appearance: Normal appearance. She is not diaphoretic.  HENT:     Head: Normocephalic and atraumatic.     Right Ear: Tympanic membrane, ear canal and external ear normal.     Left Ear: Tympanic membrane, ear canal and external ear normal.     Nose: Nose normal.     Mouth/Throat:     Mouth: Mucous membranes are moist.     Pharynx: Oropharynx is clear. No oropharyngeal exudate.  Eyes:     General: No scleral icterus.       Right eye: No discharge.        Left eye: No discharge.     Conjunctiva/sclera: Conjunctivae normal.     Pupils: Pupils are equal, round, and reactive to light.  Neck:     Thyroid: No thyromegaly.  Cardiovascular:     Rate and Rhythm: Normal rate and regular rhythm.     Heart sounds: Normal heart sounds. No murmur heard. Pulmonary:     Effort: Pulmonary effort is normal. No respiratory distress.      Breath sounds: Normal breath sounds. No wheezing or rales.  Abdominal:     General: Bowel sounds are normal. There is no distension.     Palpations: Abdomen is soft. There is no mass.     Tenderness: There is no abdominal tenderness.  Musculoskeletal:        General: No tenderness. Normal range of  motion.     Cervical back: Normal range of motion and neck supple.  Lymphadenopathy:     Cervical: No cervical adenopathy.  Skin:    General: Skin is warm and dry.     Findings: No rash.  Neurological:     General: No focal deficit present.     Mental Status: She is alert and oriented to person, place, and time.     Cranial Nerves: No cranial nerve deficit.     Coordination: Coordination normal.     Deep Tendon Reflexes: Reflexes are normal and symmetric. Reflexes normal.  Psychiatric:        Mood and Affect: Mood normal.        Behavior: Behavior normal.        Thought Content: Thought content normal.        Judgment: Judgment normal.     BP 128/82 (BP Location: Right Arm, Patient Position: Sitting, Cuff Size: Normal)   Pulse 84   Temp (!) 97.5 F (36.4 C) (Oral)   Resp 16   Ht 5\' 4"  (1.626 m)   Wt 217 lb 6.4 oz (98.6 kg)   SpO2 95%   BMI 37.32 kg/m  Wt Readings from Last 3 Encounters:  11/19/22 217 lb 6.4 oz (98.6 kg)  07/20/22 208 lb 9.6 oz (94.6 kg)  11/08/21 199 lb 6.4 oz (90.4 kg)    Diabetic Foot Exam - Simple   No data filed    Lab Results  Component Value Date   WBC 6.3 11/19/2022   HGB 13.1 11/19/2022   HCT 39.4 11/19/2022   PLT 222.0 11/19/2022   GLUCOSE 86 11/19/2022   CHOL 262 (H) 11/19/2022   TRIG 158.0 (H) 11/19/2022   HDL 61.50 11/19/2022   LDLCALC 168 (H) 11/19/2022   ALT 18 11/19/2022   AST 15 11/19/2022   NA 138 11/19/2022   K 4.5 11/19/2022   CL 102 11/19/2022   CREATININE 0.77 11/19/2022   BUN 10 11/19/2022   CO2 28 11/19/2022   TSH 1.31 11/19/2022   HGBA1C 5.7 11/19/2022    Lab Results  Component Value Date   TSH 1.31  11/19/2022   Lab Results  Component Value Date   WBC 6.3 11/19/2022   HGB 13.1 11/19/2022   HCT 39.4 11/19/2022   MCV 89.0 11/19/2022   PLT 222.0 11/19/2022   Lab Results  Component Value Date   NA 138 11/19/2022   K 4.5 11/19/2022   CO2 28 11/19/2022   GLUCOSE 86 11/19/2022   BUN 10 11/19/2022   CREATININE 0.77 11/19/2022   BILITOT 0.5 11/19/2022   ALKPHOS 56 11/19/2022   AST 15 11/19/2022   ALT 18 11/19/2022   PROT 6.5 11/19/2022   ALBUMIN 4.0 11/19/2022   CALCIUM 9.3 11/19/2022   GFR 88.29 11/19/2022   Lab Results  Component Value Date   CHOL 262 (H) 11/19/2022   Lab Results  Component Value Date   HDL 61.50 11/19/2022   Lab Results  Component Value Date   LDLCALC 168 (H) 11/19/2022   Lab Results  Component Value Date   TRIG 158.0 (H) 11/19/2022   Lab Results  Component Value Date   CHOLHDL 4 11/19/2022   Lab Results  Component Value Date   HGBA1C 5.7 11/19/2022       Assessment & Plan:  Preventative health care Assessment & Plan: Patient encouraged to maintain heart healthy diet, regular exercise, adequate sleep. Consider daily probiotics. Take medications as prescribed. Labs reviewed and ordered.  11/2020 colonoscopy repeat in 10 years. Pap in 2023 repeat in 3-5 years MGM 03/2022 repeat in 2024   Hyperlipidemia, unspecified hyperlipidemia type Assessment & Plan: Encouraged heart healthy diet such as MIND or DASH diet, increase exercise, avoid trans fats, consider a krill or fish oil cap daily  Orders: -     CBC with Differential/Platelet -     Lipid panel -     TSH  Class 1 obesity without serious comorbidity with body mass index (BMI) of 33.0 to 33.9 in adult, unspecified obesity type Assessment & Plan: Encouraged DASH or MIND diet, decrease po intake and increase exercise as tolerated. Needs 7-8 hours of sleep nightly. Avoid trans fats, eat small, frequent meals every 4-5 hours with lean proteins, complex carbs and healthy fats. Minimize  simple carbs, high fat foods and processed foods she is motivated to loose weight at this time discussed medications vs lifestyle changes and she agreed to a referral to [place] to evaluate and treat.   Orders: -     Amb Ref to Medical Weight Management  Anxiety with flying Assessment & Plan: Given Alprazolam to use prn  Orders: -     ALPRAZolam; Take 1 tablet (0.25 mg total) by mouth 2 (two) times daily as needed for anxiety.  Dispense: 10 tablet; Refill: 0  Pruritus Assessment & Plan: Intermittent try Witch Hazel Astringent prn  Orders: -     CBC with Differential/Platelet -     TSH  Hyperglycemia -     Hemoglobin A1c -     CBC with Differential/Platelet -     Comprehensive metabolic panel -     TSH  Allergy, initial encounter Assessment & Plan: Uses flonase and cetirizine daily and feels they help some but still struggles with daily PND. She can try increasing Cetirizine to twice daily or switching to Zyxal or Allegra.   Orders: -     CBC with Differential/Platelet    Penni Homans, MD

## 2022-11-19 NOTE — Patient Instructions (Addendum)
MIND or PURE diet   60-80 ounces 6-8 hours sleep 4000 to 8000 + steps a day  Preventive Care 53-53 Years Old, Female Preventive care refers to lifestyle choices and visits with your health care provider that can promote health and wellness. Preventive care visits are also called wellness exams. What can I expect for my preventive care visit? Counseling Your health care provider may ask you questions about your: Medical history, including: Past medical problems. Family medical history. Pregnancy history. Current health, including: Menstrual cycle. Method of birth control. Emotional well-being. Home life and relationship well-being. Sexual activity and sexual health. Lifestyle, including: Alcohol, nicotine or tobacco, and drug use. Access to firearms. Diet, exercise, and sleep habits. Work and work Statistician. Sunscreen use. Safety issues such as seatbelt and bike helmet use. Physical exam Your health care provider will check your: Height and weight. These may be used to calculate your BMI (body mass index). BMI is a measurement that tells if you are at a healthy weight. Waist circumference. This measures the distance around your waistline. This measurement also tells if you are at a healthy weight and may help predict your risk of certain diseases, such as type 2 diabetes and high blood pressure. Heart rate and blood pressure. Body temperature. Skin for abnormal spots. What immunizations do I need?  Vaccines are usually given at various ages, according to a schedule. Your health care provider will recommend vaccines for you based on your age, medical history, and lifestyle or other factors, such as travel or where you work. What tests do I need? Screening Your health care provider may recommend screening tests for certain conditions. This may include: Lipid and cholesterol levels. Diabetes screening. This is done by checking your blood sugar (glucose) after you have not  eaten for a while (fasting). Pelvic exam and Pap test. Hepatitis B test. Hepatitis C test. HIV (human immunodeficiency virus) test. STI (sexually transmitted infection) testing, if you are at risk. Lung cancer screening. Colorectal cancer screening. Mammogram. Talk with your health care provider about when you should start having regular mammograms. This may depend on whether you have a family history of breast cancer. BRCA-related cancer screening. This may be done if you have a family history of breast, ovarian, tubal, or peritoneal cancers. Bone density scan. This is done to screen for osteoporosis. Talk with your health care provider about your test results, treatment options, and if necessary, the need for more tests. Follow these instructions at home: Eating and drinking  Eat a diet that includes fresh fruits and vegetables, whole grains, lean protein, and low-fat dairy products. Take vitamin and mineral supplements as recommended by your health care provider. Do not drink alcohol if: Your health care provider tells you not to drink. You are pregnant, may be pregnant, or are planning to become pregnant. If you drink alcohol: Limit how much you have to 0-1 drink a day. Know how much alcohol is in your drink. In the [place] one drink equals one 12 oz bottle of beer (355 mL), one 5 oz glass of wine (148 mL), or one 1 oz glass of hard liquor (44 mL). Lifestyle Brush your teeth every morning and night with fluoride toothpaste. Floss one time each day. Exercise for at least 30 minutes 5 or more days each week. Do not use any products that contain nicotine or tobacco. These products include cigarettes, chewing tobacco, and vaping devices, such as e-cigarettes. If you need help quitting, ask your health care provider. Do not  use drugs. If you are sexually active, practice safe sex. Use a condom or other form of protection to prevent STIs. If you do not wish to become pregnant, use a form of  birth control. If you plan to become pregnant, see your health care provider for a prepregnancy visit. Take aspirin only as told by your health care provider. Make sure that you understand how much to take and what form to take. Work with your health care provider to find out whether it is safe and beneficial for you to take aspirin daily. Find healthy ways to manage stress, such as: Meditation, yoga, or listening to music. Journaling. Talking to a trusted person. Spending time with friends and family. Minimize exposure to UV radiation to reduce your risk of skin cancer. Safety Always wear your seat belt while driving or riding in a vehicle. Do not drive: If you have been drinking alcohol. Do not ride with someone who has been drinking. When you are tired or distracted. While texting. If you have been using any mind-altering substances or drugs. Wear a helmet and other protective equipment during sports activities. If you have firearms in your house, make sure you follow all gun safety procedures. Seek help if you have been physically or sexually abused. What's next? Visit your health care provider once a year for an annual wellness visit. Ask your health care provider how often you should have your eyes and teeth checked. Stay up to date on all vaccines. This information is not intended to replace advice given to you by your health care provider. Make sure you discuss any questions you have with your health care provider. Document Revised: 02/15/2021 Document Reviewed: 02/15/2021 Elsevier Patient Education  [place].

## 2022-11-20 ENCOUNTER — Other Ambulatory Visit: Payer: Self-pay

## 2022-11-20 MED ORDER — ATORVASTATIN CALCIUM 10 MG PO TABS: 10.0000 mg | ORAL_TABLET | Freq: Every day | ORAL | 3 refills | Status: DC

## 2022-12-12 ENCOUNTER — Encounter: Payer: Self-pay | Admitting: Family Medicine

## 2022-12-20 ENCOUNTER — Other Ambulatory Visit: Payer: Commercial Managed Care - PPO

## 2023-01-03 ENCOUNTER — Encounter: Payer: Self-pay | Admitting: Nurse Practitioner

## 2023-01-03 ENCOUNTER — Ambulatory Visit (INDEPENDENT_AMBULATORY_CARE_PROVIDER_SITE_OTHER): Payer: Commercial Managed Care - PPO | Admitting: Nurse Practitioner

## 2023-01-03 VITALS — BP 145/85 | HR 69 | Temp 98.3°F | Ht 64.0 in | Wt 211.0 lb

## 2023-01-03 DIAGNOSIS — Z6836 Body mass index (BMI) 36.0-36.9, adult: Secondary | ICD-10-CM | POA: Diagnosis not present

## 2023-01-03 DIAGNOSIS — E785 Hyperlipidemia, unspecified: Secondary | ICD-10-CM | POA: Diagnosis not present

## 2023-01-03 DIAGNOSIS — E669 Obesity, unspecified: Secondary | ICD-10-CM

## 2023-01-03 NOTE — Progress Notes (Signed)
Office: [phone]  /  Fax: [phone]   Initial Visit  Kristen Evans was seen in clinic today to evaluate for obesity. She is interested in losing weight to improve overall health and reduce the risk of weight related complications. She presents today to review program treatment options, initial physical assessment, and evaluation.     She was referred by: PCP  When asked what else they would like to accomplish? She states: Adopt healthier eating patterns, Improve energy levels and physical activity, Improve existing medical conditions, Improve quality of life, and Lose a target amount of weight : Goal weight 150 lbs   Going to [place] in Aug and wants to be able to hike more.    Weight history:  She started gaining weight after having her children.  Her weight has fluctuated over the years.    When asked how has your weight affected you? She states: Having fatigue and Having poor endurance  Some associated conditions: migraines, GERD, allergies, anxiety, HLD  Contributing factors: Family history, Stress, and Pregnancy  Weight promoting medications identified: None  Current nutrition plan: None  Current level of physical activity: Walking  Current or previous pharmacotherapy: None  Response to medication: Never tried medications   Past medical history includes:   Past Medical History:  Diagnosis Date   Allergy    seasonal    Anemia    with pregnancy   Fibrocystic breast disease    GERD (gastroesophageal reflux disease)    pregnancy and recently   Left ankle pain 03/04/2017   Lipoma of lower extremity 11/21/2011   Migraine    Migraines 11/21/2011   Obesity    Preventative health care 11/21/2011   Reflux 11/21/2011     Objective:   BP (!) 145/85   Pulse 69   Temp 98.3 F (36.8 C)   Ht 5\' 4"  (1.626 m)   Wt 211 lb (95.7 kg)   LMP  (LMP Unknown)   SpO2 97%   BMI 36.22 kg/m  She was weighed on the bioimpedance scale: Body mass index is 36.22 kg/m.  Peak  Weight:211 lbs , Body Fat%:42.6, Visceral Fat Rating:12, Weight trend over the last 12 months: Increasing  General:  Alert, oriented and cooperative. Patient is in no acute distress.  Respiratory: Normal respiratory effort, no problems with respiration noted   Gait: able to ambulate independently  Mental Status: Normal mood and affect. Normal behavior. Normal judgment and thought content.   DIAGNOSTIC DATA REVIEWED:  BMET    Component Value Date/Time   NA 138 11/19/2022 1133   K 4.5 11/19/2022 1133   CL 102 11/19/2022 1133   CO2 28 11/19/2022 1133   GLUCOSE 86 11/19/2022 1133   BUN 10 11/19/2022 1133   CREATININE 0.77 11/19/2022 1133   CREATININE 0.76 12/17/2011 0000   CALCIUM 9.3 11/19/2022 1133   Lab Results  Component Value Date   HGBA1C 5.7 11/19/2022   HGBA1C 5.6 11/08/2021   No results found for: "INSULIN" CBC    Component Value Date/Time   WBC 6.3 11/19/2022 1133   RBC 4.42 11/19/2022 1133   HGB 13.1 11/19/2022 1133   HCT 39.4 11/19/2022 1133   PLT 222.0 11/19/2022 1133   MCV 89.0 11/19/2022 1133   MCHC 33.3 11/19/2022 1133   RDW 14.4 11/19/2022 1133   Iron/TIBC/Ferritin/ %Sat No results found for: "IRON", "TIBC", "FERRITIN", "IRONPCTSAT" Lipid Panel     Component Value Date/Time   CHOL 262 (H) 11/19/2022 1133   TRIG 158.0 (H)  11/19/2022 1133   HDL 61.50 11/19/2022 1133   CHOLHDL 4 11/19/2022 1133   VLDL 31.6 11/19/2022 1133   LDLCALC 168 (H) 11/19/2022 1133   Hepatic Function Panel     Component Value Date/Time   PROT 6.5 11/19/2022 1133   ALBUMIN 4.0 11/19/2022 1133   AST 15 11/19/2022 1133   ALT 18 11/19/2022 1133   ALKPHOS 56 11/19/2022 1133   BILITOT 0.5 11/19/2022 1133      Component Value Date/Time   TSH 1.31 11/19/2022 1133     Assessment and Plan:   Hyperlipidemia, unspecified hyperlipidemia type Continue follow-up with PCP.   Generalized obesity  BMI 36.0-36.9,adult        Obesity Treatment / Action Plan:  Patient  will work on garnering support from family and friends to begin weight loss journey. Will work on eliminating or reducing the presence of highly palatable, calorie dense foods in the home. Will complete provided nutritional and psychosocial assessment questionnaire before the next appointment. Will be scheduled for indirect calorimetry to determine resting energy expenditure in a fasting state.  This will allow [place] to create a reduced calorie, high-protein meal plan to promote loss of fat mass while preserving muscle mass.  Obesity Education Performed Today:  She was weighed on the bioimpedance scale and results were discussed and documented in the synopsis.  We discussed obesity as a disease and the importance of a more detailed evaluation of all the factors contributing to the disease.  We discussed the importance of long term lifestyle changes which include nutrition, exercise and behavioral modifications as well as the importance of customizing this to her specific health and social needs.  We discussed the benefits of reaching a healthier weight to alleviate the symptoms of existing conditions and reduce the risks of the biomechanical, metabolic and psychological effects of obesity.  Kristen Evans appears to be in the action stage of change and states they are ready to start intensive lifestyle modifications and behavioral modifications.  30 minutes was spent today on this visit including the above counseling, pre-visit chart review, and post-visit documentation.  Reviewed by clinician on day of visit: allergies, medications, problem list, medical history, surgical history, family history, social history, and previous encounter notes pertinent to obesity diagnosis.    Kristen Evans Kristen Gilroy FNP-C

## 2023-01-23 ENCOUNTER — Encounter: Payer: Self-pay | Admitting: Family Medicine

## 2023-01-23 ENCOUNTER — Other Ambulatory Visit: Payer: Self-pay | Admitting: Family Medicine

## 2023-01-23 MED ORDER — ROSUVASTATIN CALCIUM 5 MG PO TABS: 5.0000 mg | ORAL_TABLET | Freq: Every day | ORAL | 3 refills | Status: DC

## 2023-02-01 ENCOUNTER — Encounter: Payer: Self-pay | Admitting: Family Medicine

## 2023-02-01 ENCOUNTER — Other Ambulatory Visit: Payer: Self-pay

## 2023-02-01 ENCOUNTER — Telehealth: Payer: Self-pay | Admitting: Family Medicine

## 2023-02-01 DIAGNOSIS — L299 Pruritus, unspecified: Secondary | ICD-10-CM

## 2023-02-01 DIAGNOSIS — E785 Hyperlipidemia, unspecified: Secondary | ICD-10-CM

## 2023-02-01 DIAGNOSIS — R739 Hyperglycemia, unspecified: Secondary | ICD-10-CM

## 2023-02-01 NOTE — Telephone Encounter (Signed)
Pt called in to reschedule her lab appt. After reviewing chart, noticed that there were no active orders for labs. Went ahead and made lab appt for 8.26.24 and advised a note would be sent back for lab orders.

## 2023-02-01 NOTE — Telephone Encounter (Signed)
Labs ordered.

## 2023-02-18 ENCOUNTER — Other Ambulatory Visit: Payer: Commercial Managed Care - PPO

## 2023-03-21 LAB — HM MAMMOGRAPHY

## 2023-04-01 NOTE — Progress Notes (Unsigned)
Subjective:    Patient ID: Kristen Evans, female    DOB: 1971/02/21, 52 y.o.   MRN: [id number]  No chief complaint on file.   HPI Discussed the use of AI scribe software for clinical note transcription with the patient, who gave verbal consent to proceed.  History of Present Illness   Patient is a 52 yo female in for follow up on chronic medical concerns. No recent febrile illness or hospitalizations. Denies CP/palp/SOB/HA/congestion/fevers/GI or GU c/o. Taking meds as prescribed           Past Medical History:  Diagnosis Date   Allergy    seasonal    Anemia    with pregnancy   Fibrocystic breast disease    GERD (gastroesophageal reflux disease)    pregnancy and recently   Left ankle pain 03/04/2017   Lipoma of lower extremity 11/21/2011   Migraine    Migraines 11/21/2011   Obesity    Preventative health care 11/21/2011   Reflux 11/21/2011    Past Surgical History:  Procedure Laterality Date   ANTERIOR AND POSTERIOR VAGINAL REPAIR     bilateral apical suspension  06/2020   BREAST SURGERY  1987   biopsy   dental implants     perineorrhaphy     WISDOM TOOTH EXTRACTION      Family History  Problem Relation Age of Onset   Cancer Mother        breast   Hypertension Mother    Breast cancer Mother    Colon polyps Mother    Diabetes Father    Hyperlipidemia Father    Other Father        polycythemia  vera   Heart disease Father        CAD s/p CABG x 4 vessels   GER disease Daughter    Arthritis Maternal Grandmother    Cancer Maternal Grandmother        breast   Hyperlipidemia Maternal Grandmother    Stroke Maternal Grandmother    Breast cancer Maternal Grandmother    Heart disease Maternal Grandfather    Hyperlipidemia Maternal Grandfather    Stroke Maternal Grandfather    Heart disease Paternal Grandfather    Colon cancer Neg Hx    Esophageal cancer Neg Hx    Rectal cancer Neg Hx    Stomach cancer Neg Hx     Social History   Socioeconomic History    Marital status: Married    Spouse name: Not on file   Number of children: Not on file   Years of education: Not on file   Highest education level: Not on file  Occupational History   Not on file  Tobacco Use   Smoking status: Never   Smokeless tobacco: Never  Vaping Use   Vaping status: Never Used  Substance and Sexual Activity   Alcohol use: No   Drug use: No   Sexual activity: Yes  Other Topics Concern   Not on file  Social History Narrative   Not on file   Social Determinants of Health   Financial Resource Strain: Not on file  Food Insecurity: Not on file  Transportation Needs: Not on file  Physical Activity: Not on file  Stress: Not on file  Social Connections: Not on file  Intimate Partner Violence: Not on file    Outpatient Medications Prior to Visit  Medication Sig Dispense Refill   rosuvastatin (CRESTOR) 5 MG tablet Take 1 tablet (5 mg total) by mouth daily. 30  tablet 3   ALPRAZolam (XANAX) 0.25 MG tablet Take 1 tablet (0.25 mg total) by mouth 2 (two) times daily as needed for anxiety. 10 tablet 0   aspirin-acetaminophen-caffeine (EXCEDRIN MIGRAINE) 250-250-65 MG tablet Take 1 tablet by mouth every 6 (six) hours as needed.     Cranberry (RA CRANBERRY) 500 MG CAPS Take by mouth.     famotidine (PEPCID) 20 MG tablet Take 20 mg by mouth daily as needed for heartburn or indigestion.     fluticasone (FLONASE) 50 MCG/ACT nasal spray      No facility-administered medications prior to visit.    Allergies  Allergen Reactions   Atorvastatin    Penicillins Hives    Happened in childhood, may have spread all over the body.    Review of Systems  Constitutional:  Negative for fever and malaise/fatigue.  HENT:  Negative for congestion.   Eyes:  Negative for blurred vision.  Respiratory:  Negative for shortness of breath.   Cardiovascular:  Negative for chest pain, palpitations and leg swelling.  Gastrointestinal:  Negative for abdominal pain, blood in stool and  nausea.  Genitourinary:  Negative for dysuria and frequency.  Musculoskeletal:  Negative for falls.  Skin:  Negative for rash.  Neurological:  Negative for dizziness, loss of consciousness and headaches.  Endo/Heme/Allergies:  Negative for environmental allergies.  Psychiatric/Behavioral:  Negative for depression. The patient is not nervous/anxious.        Objective:    Physical Exam Constitutional:      General: She is not in acute distress.    Appearance: Normal appearance. She is well-developed. She is not toxic-appearing.  HENT:     Head: Normocephalic and atraumatic.     Right Ear: External ear normal.     Left Ear: External ear normal.     Nose: Nose normal.  Eyes:     General:        Right eye: No discharge.        Left eye: No discharge.     Conjunctiva/sclera: Conjunctivae normal.  Neck:     Thyroid: No thyromegaly.  Cardiovascular:     Rate and Rhythm: Normal rate and regular rhythm.     Heart sounds: Normal heart sounds. No murmur heard. Pulmonary:     Effort: Pulmonary effort is normal. No respiratory distress.     Breath sounds: Normal breath sounds.  Abdominal:     General: Bowel sounds are normal.     Palpations: Abdomen is soft.     Tenderness: There is no abdominal tenderness. There is no guarding.  Musculoskeletal:        General: Normal range of motion.     Cervical back: Neck supple.  Lymphadenopathy:     Cervical: No cervical adenopathy.  Skin:    General: Skin is warm and dry.  Neurological:     Mental Status: She is alert and oriented to person, place, and time.  Psychiatric:        Mood and Affect: Mood normal.        Behavior: Behavior normal.        Thought Content: Thought content normal.        Judgment: Judgment normal.     LMP  (LMP Unknown)  Wt Readings from Last 3 Encounters:  01/03/23 211 lb (95.7 kg)  11/19/22 217 lb 6.4 oz (98.6 kg)  07/20/22 208 lb 9.6 oz (94.6 kg)    Diabetic Foot Exam - Simple   No data filed  Lab Results  Component Value Date   WBC 6.3 11/19/2022   HGB 13.1 11/19/2022   HCT 39.4 11/19/2022   PLT 222.0 11/19/2022   GLUCOSE 86 11/19/2022   CHOL 262 (H) 11/19/2022   TRIG 158.0 (H) 11/19/2022   HDL 61.50 11/19/2022   LDLCALC 168 (H) 11/19/2022   ALT 18 11/19/2022   AST 15 11/19/2022   NA 138 11/19/2022   K 4.5 11/19/2022   CL 102 11/19/2022   CREATININE 0.77 11/19/2022   BUN 10 11/19/2022   CO2 28 11/19/2022   TSH 1.31 11/19/2022   HGBA1C 5.7 11/19/2022    Lab Results  Component Value Date   TSH 1.31 11/19/2022   Lab Results  Component Value Date   WBC 6.3 11/19/2022   HGB 13.1 11/19/2022   HCT 39.4 11/19/2022   MCV 89.0 11/19/2022   PLT 222.0 11/19/2022   Lab Results  Component Value Date   NA 138 11/19/2022   K 4.5 11/19/2022   CO2 28 11/19/2022   GLUCOSE 86 11/19/2022   BUN 10 11/19/2022   CREATININE 0.77 11/19/2022   BILITOT 0.5 11/19/2022   ALKPHOS 56 11/19/2022   AST 15 11/19/2022   ALT 18 11/19/2022   PROT 6.5 11/19/2022   ALBUMIN 4.0 11/19/2022   CALCIUM 9.3 11/19/2022   GFR 88.29 11/19/2022   Lab Results  Component Value Date   CHOL 262 (H) 11/19/2022   Lab Results  Component Value Date   HDL 61.50 11/19/2022   Lab Results  Component Value Date   LDLCALC 168 (H) 11/19/2022   Lab Results  Component Value Date   TRIG 158.0 (H) 11/19/2022   Lab Results  Component Value Date   CHOLHDL 4 11/19/2022   Lab Results  Component Value Date   HGBA1C 5.7 11/19/2022       Assessment & Plan:  Hyperlipidemia, unspecified hyperlipidemia type Assessment & Plan: Encouraged heart healthy diet such as MIND or DASH diet, increase exercise, avoid trans fats, consider a krill or fish oil cap daily   Gastroesophageal reflux disease, unspecified whether esophagitis present Assessment & Plan: Avoid offending foods, start probiotics. Do not eat large meals in late evening and consider raising head of bed.       Assessment and Plan               Danise Edge, MD

## 2023-04-01 NOTE — Assessment & Plan Note (Signed)
Encouraged increased hydration, 64 ounces of clear fluids daily. Minimize alcohol and caffeine. Eat small frequent meals with lean proteins and complex carbs. Avoid high and low blood sugars. Get adequate sleep, 7-8 hours a night. Needs exercise daily preferably in the morning.  

## 2023-04-01 NOTE — Assessment & Plan Note (Signed)
Encouraged heart healthy diet such as MIND or DASH diet, increase exercise, avoid trans fats, consider a krill or fish oil cap daily

## 2023-04-01 NOTE — Assessment & Plan Note (Signed)
Historically she has had intermittent trouble but now is much more consiste   Avoid offending foods, start probiotics. Do not eat large meals in late evening and consider raising head of bed.

## 2023-04-02 ENCOUNTER — Ambulatory Visit (INDEPENDENT_AMBULATORY_CARE_PROVIDER_SITE_OTHER): Payer: Commercial Managed Care - PPO | Admitting: Family Medicine

## 2023-04-02 VITALS — BP 138/82 | HR 86 | Temp 98.4°F | Resp 16 | Ht 64.0 in | Wt 206.8 lb

## 2023-04-02 DIAGNOSIS — K219 Gastro-esophageal reflux disease without esophagitis: Secondary | ICD-10-CM | POA: Diagnosis not present

## 2023-04-02 DIAGNOSIS — E785 Hyperlipidemia, unspecified: Secondary | ICD-10-CM | POA: Diagnosis not present

## 2023-04-02 DIAGNOSIS — R1013 Epigastric pain: Secondary | ICD-10-CM | POA: Diagnosis not present

## 2023-04-02 DIAGNOSIS — R0600 Dyspnea, unspecified: Secondary | ICD-10-CM

## 2023-04-02 DIAGNOSIS — G43909 Migraine, unspecified, not intractable, without status migrainosus: Secondary | ICD-10-CM

## 2023-04-02 LAB — H. PYLORI ANTIBODY, IGG: H Pylori IgG: NEGATIVE

## 2023-04-02 MED ORDER — OMEPRAZOLE 40 MG PO CPDR: 40.0000 mg | DELAYED_RELEASE_CAPSULE | Freq: Every day | ORAL | 3 refills | Status: AC

## 2023-04-02 NOTE — Assessment & Plan Note (Signed)
Only with strenuous activity like walking up hill, with rest she recovers and no trouble with ADLs she is offered a echo to evaluate but she declines due to insurance concerns. For now slowly increase exercise tolerance level and let us know if symptoms worsen

## 2023-04-02 NOTE — Assessment & Plan Note (Signed)
Encouraged DASH or MIND diet, decrease po intake and increase exercise as tolerated. Needs 7-8 hours of sleep nightly. Avoid trans fats, eat small, frequent meals every 4-5 hours with lean proteins, complex carbs and healthy fats. Minimize simple carbs, high fat foods and processed foods she is motivated to loose weight at this time discussed medications vs lifestyle changes and she agreed to a referral to [place] to evaluate and treat.

## 2023-04-02 NOTE — Patient Instructions (Addendum)
[employer] on [employer]  NOW multistrain probiotic [facility] probiotic online    Hiatal Hernia  A hiatal hernia occurs when part of the stomach slides above the muscle that separates the abdomen from the chest (diaphragm). A person can be born with a hiatal hernia (congenital), or it may develop over time. In almost all cases of hiatal hernia, only the top part of the stomach pushes through the diaphragm. Many people have a hiatal hernia with no symptoms. The larger the hernia, the more likely it is that you will have symptoms. In some cases, a hiatal hernia allows stomach acid to flow back into the tube that carries food from your mouth to your stomach (esophagus). This may cause heartburn symptoms. The development of heartburn symptoms may mean that you have a condition called gastroesophageal reflux disease (GERD). What are the causes? This condition is caused by a weakness in the opening (hiatus) where the esophagus passes through the diaphragm to attach to the upper part of the stomach. A person may be born with a weakness in the hiatus, or a weakness can develop over time. What increases the risk? This condition is more likely to develop in: Older people. Age is a major risk factor for a hiatal hernia, especially if you are over the age of 87. Pregnant women. People who are overweight. People who have frequent constipation. What are the signs or symptoms? Symptoms of this condition usually develop in the form of GERD symptoms. Symptoms include: Heartburn. Upset stomach (indigestion). Trouble swallowing. Coughing or wheezing. Wheezing is making high-pitched whistling sounds when you breathe. Sore throat. Chest pain. Nausea and vomiting. How is this diagnosed? This condition may be diagnosed during testing for GERD. Tests that may be done include: X-rays of your stomach or chest. An upper gastrointestinal (GI) series. This is an X-ray exam of your GI tract that is taken after you  swallow a chalky liquid that shows up clearly on the X-ray. Endoscopy. This is a procedure to look into your stomach using a thin, flexible tube that has a tiny camera and light on the end of it. How is this treated? This condition may be treated by: Dietary and lifestyle changes to help reduce GERD symptoms. Medicines. These may include: Over-the-counter antacids. Medicines that make your stomach empty more quickly. Medicines that block the production of stomach acid (H2 blockers). Stronger medicines to reduce stomach acid (proton pump inhibitors). Surgery to repair the hernia, if other treatments are not helping. If you have no symptoms, you may not need treatment. Follow these instructions at home: Lifestyle and activity Do not use any products that contain nicotine or tobacco. These products include cigarettes, chewing tobacco, and vaping devices, such as e-cigarettes. If you need help quitting, ask your health care provider. Try to achieve and maintain a healthy body weight. Avoid putting pressure on your abdomen. Anything that puts pressure on your abdomen increases the amount of acid that may be pushed up into your esophagus. Avoid bending over, especially after eating. Raise the head of your bed by putting blocks under the legs. This keeps your head and esophagus higher than your stomach. Do not wear tight clothing around your chest or stomach. Try not to strain when having a bowel movement, when urinating, or when lifting heavy objects. Eating and drinking Avoid foods that can worsen GERD symptoms. These may include: Fatty foods, like fried foods. Citrus fruits, like oranges or lemon. Other foods and drinks that contain acid, like orange juice  or tomatoes. Spicy food. Chocolate. Eat frequent small meals instead of three large meals a day. This helps prevent your stomach from getting too full. Eat slowly. Do not lie down right after eating. Do not eat 1-2 hours before bed. Do  not drink beverages with caffeine. These include cola, coffee, cocoa, and tea. Do not drink alcohol. General instructions Take over-the-counter and prescription medicines only as told by your health care provider. Keep all follow-up visits. Your health care provider will want to check that any new prescribed medicines are helping your symptoms. Contact a health care provider if: Your symptoms are not controlled with medicines or lifestyle changes. You are having trouble swallowing. You have coughing or wheezing that will not go away. Your pain is getting worse. Your pain spreads to your arms, neck, jaw, teeth, or back. You feel nauseous or you vomit. Get help right away if: You have shortness of breath. You vomit blood. You have bright red blood in your stools. You have black, tarry stools. These symptoms may be an emergency. Get help right away. Call 911. Do not wait to see if the symptoms will go away. Do not drive yourself to the hospital. Summary A hiatal hernia occurs when part of the stomach slides above the muscle that separates the abdomen from the chest. A person may be born with a weakness in the hiatus, or a weakness can develop over time. Symptoms of a hiatal hernia may include heartburn, trouble swallowing, or sore throat. Management of a hiatal hernia includes eating frequent small meals instead of three large meals a day. Get help right away if you vomit blood, have bright red blood in your stools, or have black, tarry stools. This information is not intended to replace advice given to you by your health care provider. Make sure you discuss any questions you have with your health care provider. Document Revised: 10/17/2021 Document Reviewed: 10/17/2021 Elsevier Patient Education  2024 [employer].

## 2023-04-02 NOTE — Assessment & Plan Note (Signed)
Check H Pylori IgG and start Omeprazole 40 mg daily, referred to gastroenterology for evaluation

## 2023-04-18 ENCOUNTER — Encounter (INDEPENDENT_AMBULATORY_CARE_PROVIDER_SITE_OTHER): Payer: Self-pay

## 2023-04-29 ENCOUNTER — Other Ambulatory Visit (INDEPENDENT_AMBULATORY_CARE_PROVIDER_SITE_OTHER): Payer: Commercial Managed Care - PPO

## 2023-04-29 DIAGNOSIS — R739 Hyperglycemia, unspecified: Secondary | ICD-10-CM | POA: Diagnosis not present

## 2023-04-29 DIAGNOSIS — L299 Pruritus, unspecified: Secondary | ICD-10-CM

## 2023-04-29 LAB — CBC WITH DIFFERENTIAL/PLATELET
Basophils Absolute: 0 10*3/uL (ref 0.0–0.1)
Basophils Relative: 0.5 % (ref 0.0–3.0)
Eosinophils Absolute: 0.1 10*3/uL (ref 0.0–0.7)
Eosinophils Relative: 2.6 % (ref 0.0–5.0)
HCT: 41.8 % (ref 36.0–46.0)
Hemoglobin: 13.5 g/dL (ref 12.0–15.0)
Lymphocytes Relative: 23.4 % (ref 12.0–46.0)
Lymphs Abs: 1.1 10*3/uL (ref 0.7–4.0)
MCHC: 32.3 g/dL (ref 30.0–36.0)
MCV: 88.3 fl (ref 78.0–100.0)
Monocytes Absolute: 0.4 10*3/uL (ref 0.1–1.0)
Monocytes Relative: 9 % (ref 3.0–12.0)
Neutro Abs: 2.9 10*3/uL (ref 1.4–7.7)
Neutrophils Relative %: 64.5 % (ref 43.0–77.0)
Platelets: 213 10*3/uL (ref 150.0–400.0)
RBC: 4.73 Mil/uL (ref 3.87–5.11)
RDW: 14.3 % (ref 11.5–15.5)
WBC: 4.6 10*3/uL (ref 4.0–10.5)

## 2023-04-29 LAB — COMPREHENSIVE METABOLIC PANEL
ALT: 25 U/L (ref 0–35)
AST: 17 U/L (ref 0–37)
Albumin: 4.2 g/dL (ref 3.5–5.2)
Alkaline Phosphatase: 57 U/L (ref 39–117)
BUN: 12 mg/dL (ref 6–23)
CO2: 29 mEq/L (ref 19–32)
Calcium: 9.6 mg/dL (ref 8.4–10.5)
Chloride: 101 mEq/L (ref 96–112)
Creatinine, Ser: 0.81 mg/dL (ref 0.40–1.20)
GFR: 82.83 mL/min (ref >60.00)
Glucose, Bld: 97 mg/dL (ref 70–99)
Potassium: 4.5 mEq/L (ref 3.5–5.1)
Sodium: 138 mEq/L (ref 135–145)
Total Bilirubin: 0.5 mg/dL (ref 0.2–1.2)
Total Protein: 6.7 g/dL (ref 6.0–8.3)

## 2023-04-29 LAB — TSH: TSH: 1.33 u[IU]/mL (ref 0.35–5.50)

## 2023-04-29 LAB — HEMOGLOBIN A1C: Hgb A1c MFr Bld: 5.7 % (ref 4.6–6.5)

## 2023-05-04 ENCOUNTER — Other Ambulatory Visit: Payer: Self-pay | Admitting: Family Medicine

## 2023-05-10 ENCOUNTER — Telehealth: Payer: Commercial Managed Care - PPO | Admitting: Physician Assistant

## 2023-05-10 DIAGNOSIS — R3989 Other symptoms and signs involving the genitourinary system: Secondary | ICD-10-CM

## 2023-05-10 MED ORDER — NITROFURANTOIN MONOHYD MACRO 100 MG PO CAPS: 100.0000 mg | ORAL_CAPSULE | Freq: Two times a day (BID) | ORAL | 0 refills | Status: DC

## 2023-05-10 NOTE — Progress Notes (Signed)

## 2023-05-26 NOTE — Assessment & Plan Note (Signed)
Infrequent and responsive to Zomig and Ibuprofen, may use them together as needed.Infrequent and responsive to Zomig and Ibuprofen, may use them together as needed.

## 2023-05-26 NOTE — Assessment & Plan Note (Signed)
Avoid offending foods, start probiotics. Do not eat large meals in late evening and consider raising head of bed.  Seeing gastroenterology in October. Omeprazole at 40 mg daily now down to 20 mg every other day, very few symptoms so far.

## 2023-05-26 NOTE — Assessment & Plan Note (Signed)
Encouraged heart healthy diet such as MIND or DASH diet, increase exercise, avoid trans fats, consider a krill or fish oil cap daily

## 2023-05-27 ENCOUNTER — Encounter: Payer: Self-pay | Admitting: Family Medicine

## 2023-05-27 ENCOUNTER — Ambulatory Visit (INDEPENDENT_AMBULATORY_CARE_PROVIDER_SITE_OTHER): Payer: Commercial Managed Care - PPO | Admitting: Family Medicine

## 2023-05-27 VITALS — BP 128/76 | HR 83 | Temp 98.3°F | Resp 16 | Ht 64.0 in | Wt 201.4 lb

## 2023-05-27 DIAGNOSIS — E785 Hyperlipidemia, unspecified: Secondary | ICD-10-CM | POA: Diagnosis not present

## 2023-05-27 DIAGNOSIS — G43809 Other migraine, not intractable, without status migrainosus: Secondary | ICD-10-CM | POA: Diagnosis not present

## 2023-05-27 DIAGNOSIS — Z79899 Other long term (current) drug therapy: Secondary | ICD-10-CM

## 2023-05-27 DIAGNOSIS — N3000 Acute cystitis without hematuria: Secondary | ICD-10-CM

## 2023-05-27 DIAGNOSIS — K219 Gastro-esophageal reflux disease without esophagitis: Secondary | ICD-10-CM | POA: Diagnosis not present

## 2023-05-27 LAB — LIPID PANEL
Cholesterol: 158 mg/dL (ref 0–200)
HDL: 58.7 mg/dL (ref >39.00)
LDL Cholesterol: 75 mg/dL (ref 0–99)
NonHDL: 99.36
Total CHOL/HDL Ratio: 3
Triglycerides: 121 mg/dL (ref 0.0–149.0)
VLDL: 24.2 mg/dL (ref 0.0–40.0)

## 2023-05-27 NOTE — Patient Instructions (Signed)
[facility]ulum probiotics online   Heartburn Heartburn is a type of pain or discomfort that can happen in the throat or chest. It is often described as a burning pain. It may also cause a bad, acid-like taste in the mouth. Heartburn may feel worse when you lie down or bend over, and it is often worse at night. Heartburn may be caused by stomach contents that move back up into the esophagus (reflux). Follow these instructions at home: Eating and drinking  Avoid certain foods and drinks as told by your health care provider. This may include: Coffee and tea, with or without caffeine. Drinks that contain alcohol. Energy drinks and sports drinks. Carbonated drinks or sodas. Chocolate and cocoa. Peppermint and mint flavorings. Garlic and onions. Horseradish. Spicy and acidic foods, including peppers, chili powder, curry powder, vinegar, hot sauces, and barbecue sauce. Citrus fruit juices and citrus fruits, such as oranges, lemons, and limes. Tomato-based foods, such as red sauce, chili, salsa, and pizza with red sauce. Fried and fatty foods, such as donuts, french fries, potato chips, and high-fat dressings. High-fat meats, such as hot dogs and fatty cuts of red and white meats, such as rib eye steak, sausage, ham, and bacon. High-fat dairy items, such as whole milk, butter, and cream cheese. Eat small, frequent meals instead of large meals. Avoid drinking large amounts of liquid with your meals. Avoid eating meals during the 2-3 hours before bedtime. Avoid lying down right after you eat. Do not exercise right after you eat. Lifestyle     If you are overweight, reduce your weight to an amount that is healthy for you. Ask your health care provider for guidance about a safe weight loss goal. Do not use any products that contain nicotine or tobacco. These products include cigarettes, chewing tobacco, and vaping devices, such as e-cigarettes. These can make symptoms worse. If you need help quitting,  ask your health care provider. Wear loose-fitting clothing. Do not wear anything tight around your waist that causes pressure on your abdomen. Raise (elevate) the head of your bed about 6 inches (15 cm) when you sleep. You can use a wedge to do this. Try to reduce your stress, such as with yoga or meditation. If you need help reducing stress, ask your health care provider. Medicines Take over-the-counter and prescription medicines only as told by your health care provider. Do not take aspirin or NSAIDs, such as ibuprofen, unless your health care provider told you to do so. Stop medicines only as told by your health care provider. If you stop taking some medicines too quickly, your symptoms may get worse. General instructions Pay attention to any changes in your symptoms. Keep all follow-up visits. This is important. Contact a health care provider if: You have new symptoms. You have unexplained weight loss. You have difficulty swallowing, or it hurts to swallow. You have wheezing or a persistent cough. Your symptoms do not improve with treatment. You have frequent heartburn for more than 2 weeks. Get help right away if: You suddenly have pain in your arms, neck, jaw, teeth, or back. You suddenly feel sweaty, dizzy, or light-headed. You have chest pain or shortness of breath. You vomit and your vomit looks like blood or coffee grounds. Your stool is bloody or black. These symptoms may represent a serious problem that is an emergency. Do not wait to see if the symptoms will go away. Get medical help right away. Call your local emergency services (911 in the [place] Do not drive yourself  to the hospital. Summary Heartburn is a type of pain or discomfort that can happen in the throat or chest. It is often described as a burning pain. It may also cause a bad, acid-like taste in the mouth. Avoid certain foods and drinks as told by your health care provider. Take over-the-counter and prescription  medicines only as told by your health care provider. Do not take aspirin or NSAIDs, such as ibuprofen, unless your health care provider told you to do so. Contact a health care provider if your symptoms do not improve or they get worse. This information is not intended to replace advice given to you by your health care provider. Make sure you discuss any questions you have with your health care provider. Document Revised: 02/23/2020 Document Reviewed: 02/24/2020 Elsevier Patient Education  2024 [employer].

## 2023-05-27 NOTE — Assessment & Plan Note (Addendum)
Cranberry tabs daily, no recent infectious symptoms

## 2023-05-27 NOTE — Progress Notes (Signed)
Subjective:    Patient ID: Kristen Evans, female    DOB: [birth year], 52 y.o.   MRN: [id number]  Chief Complaint  Patient presents with   Follow-up    Follow up    HPI Discussed the use of AI scribe software for clinical note transcription with the patient, who gave verbal consent to proceed.  History of Present Illness   The patient, with a history of hyperlipidemia and GERD, presents for a follow-up visit. They report no new symptoms or concerns. They have been taking rosuvastatin for hyperlipidemia and recently had labs drawn, but a lipid panel was not included.  Regarding their GERD, the patient has been managing their symptoms with a proton pump inhibitor (PPI), initially at 40mg  daily. They report no symptoms while on this dose. Recently, they have been trying to decrease their PPI use and have reduced the dose to 20mg  every other day. They have noticed a slight return of symptoms but nothing as severe as before. They are also implementing lifestyle modifications such as raising the head of the bed, not eating close to bedtime, and avoiding spicy and fatty foods, especially in the evenings.  The patient also mentions that they have not been as active in the past couple of weeks but plans to increase their physical activity. They are also taking a cranberry supplement with a prebiotic for urinary tract infection prevention and occasionally take a fiber gummy with a prebiotic.        Past Medical History:  Diagnosis Date   Allergy    seasonal    Anemia    with pregnancy   Fibrocystic breast disease    GERD (gastroesophageal reflux disease)    pregnancy and recently   Left ankle pain 03/04/2017   Lipoma of lower extremity 11/21/2011   Migraine    Migraines 11/21/2011   Obesity    Preventative health care 11/21/2011   Reflux 11/21/2011    Past Surgical History:  Procedure Laterality Date   ANTERIOR AND POSTERIOR VAGINAL REPAIR     bilateral apical suspension  06/2020    BREAST SURGERY  1987   biopsy   dental implants     perineorrhaphy     WISDOM TOOTH EXTRACTION      Family History  Problem Relation Age of Onset   Cancer Mother        breast   Hypertension Mother    Breast cancer Mother    Colon polyps Mother    Diabetes Father    Hyperlipidemia Father    Other Father        polycythemia  vera   Heart disease Father        CAD s/p CABG x 4 vessels   GER disease Daughter    Arthritis Maternal Grandmother    Cancer Maternal Grandmother        breast   Hyperlipidemia Maternal Grandmother    Stroke Maternal Grandmother    Breast cancer Maternal Grandmother    Heart disease Maternal Grandfather    Hyperlipidemia Maternal Grandfather    Stroke Maternal Grandfather    Heart disease Paternal Grandfather    Colon cancer Neg Hx    Esophageal cancer Neg Hx    Rectal cancer Neg Hx    Stomach cancer Neg Hx     Social History   Socioeconomic History   Marital status: Married    Spouse name: Not on file   Number of children: Not on file   Years of education:  Not on file   Highest education level: Bachelor's degree (e.g., BA, AB, BS)  Occupational History   Not on file  Tobacco Use   Smoking status: Never   Smokeless tobacco: Never  Vaping Use   Vaping status: Never Used  Substance and Sexual Activity   Alcohol use: No   Drug use: No   Sexual activity: Yes  Other Topics Concern   Not on file  Social History Narrative   Not on file   Social Determinants of Health   Financial Resource Strain: Low Risk  (04/02/2023)   Overall Financial Resource Strain (CARDIA)    Difficulty of Paying Living Expenses: Not very hard  Food Insecurity: No Food Insecurity (04/02/2023)   Hunger Vital Sign    Worried About Running Out of Food in the Last Year: Never true    Ran Out of Food in the Last Year: Never true  Transportation Needs: No Transportation Needs (04/02/2023)   PRAPARE - Administrator, Civil Service (Medical): No    Lack of  Transportation (Non-Medical): No  Physical Activity: Insufficiently Active (04/02/2023)   Exercise Vital Sign    Days of Exercise per Week: 4 days    Minutes of Exercise per Session: 30 min  Stress: No Stress Concern Present (04/02/2023)   [employer] of Occupational Health - Occupational Stress Questionnaire    Feeling of Stress : Only a little  Social Connections: Socially Integrated (04/02/2023)   Social Connection and Isolation Panel [NHANES]    Frequency of Communication with Friends and Family: More than three times a week    Frequency of Social Gatherings with Friends and Family: Twice a week    Attends Religious Services: More than 4 times per year    Active Member of [employer] or Organizations: Yes    Attends Engineer, structural: More than 4 times per year    Marital Status: Married  Catering manager Violence: Not on file    Outpatient Medications Prior to Visit  Medication Sig Dispense Refill   ALPRAZolam (XANAX) 0.25 MG tablet Take 1 tablet (0.25 mg total) by mouth 2 (two) times daily as needed for anxiety. 10 tablet 0   aspirin-acetaminophen-caffeine (EXCEDRIN MIGRAINE) 250-250-65 MG tablet Take 1 tablet by mouth every 6 (six) hours as needed.     Cranberry (RA CRANBERRY) 500 MG CAPS Take by mouth.     famotidine (PEPCID) 20 MG tablet Take 20 mg by mouth daily as needed for heartburn or indigestion.     fluticasone (FLONASE) 50 MCG/ACT nasal spray      omeprazole (PRILOSEC) 40 MG capsule Take 1 capsule (40 mg total) by mouth daily. 30 capsule 3   rosuvastatin (CRESTOR) 5 MG tablet TAKE 1 TABLET (5 MG TOTAL) BY MOUTH DAILY. 30 tablet 3   nitrofurantoin, macrocrystal-monohydrate, (MACROBID) 100 MG capsule Take 1 capsule (100 mg total) by mouth 2 (two) times daily. 10 capsule 0   No facility-administered medications prior to visit.    Allergies  Allergen Reactions   Atorvastatin    Penicillins Hives    Happened in childhood, may have spread all over the body.     Review of Systems  Constitutional:  Negative for fever and malaise/fatigue.  HENT:  Negative for congestion.   Eyes:  Negative for blurred vision.  Respiratory:  Negative for shortness of breath.   Cardiovascular:  Negative for chest pain, palpitations and leg swelling.  Gastrointestinal:  Negative for abdominal pain, blood in stool and nausea.  Genitourinary:  Negative for dysuria and frequency.  Musculoskeletal:  Negative for falls.  Skin:  Negative for rash.  Neurological:  Negative for dizziness, loss of consciousness and headaches.  Endo/Heme/Allergies:  Negative for environmental allergies.  Psychiatric/Behavioral:  Negative for depression. The patient is not nervous/anxious.        Objective:    Physical Exam Constitutional:      General: She is not in acute distress.    Appearance: Normal appearance. She is well-developed. She is not toxic-appearing.  HENT:     Head: Normocephalic and atraumatic.     Right Ear: External ear normal.     Left Ear: External ear normal.     Nose: Nose normal.  Eyes:     General:        Right eye: No discharge.        Left eye: No discharge.     Conjunctiva/sclera: Conjunctivae normal.  Neck:     Thyroid: No thyromegaly.  Cardiovascular:     Rate and Rhythm: Normal rate and regular rhythm.     Heart sounds: Normal heart sounds. No murmur heard. Pulmonary:     Effort: Pulmonary effort is normal. No respiratory distress.     Breath sounds: Normal breath sounds.  Abdominal:     General: Bowel sounds are normal.     Palpations: Abdomen is soft.     Tenderness: There is no abdominal tenderness. There is no guarding.  Musculoskeletal:        General: Normal range of motion.     Cervical back: Neck supple.  Lymphadenopathy:     Cervical: No cervical adenopathy.  Skin:    General: Skin is warm and dry.  Neurological:     Mental Status: She is alert and oriented to person, place, and time.  Psychiatric:        Mood and Affect:  Mood normal.        Behavior: Behavior normal.        Thought Content: Thought content normal.        Judgment: Judgment normal.     BP 128/76 (BP Location: Left Arm, Patient Position: Sitting, Cuff Size: Normal)   Pulse 83   Temp 98.3 F (36.8 C) (Oral)   Resp 16   Ht 5\' 4"  (1.626 m)   Wt 201 lb 6.4 oz (91.4 kg)   LMP  (LMP Unknown)   SpO2 96%   BMI 34.57 kg/m  Wt Readings from Last 3 Encounters:  05/27/23 201 lb 6.4 oz (91.4 kg)  04/02/23 206 lb 12.8 oz (93.8 kg)  01/03/23 211 lb (95.7 kg)    Diabetic Foot Exam - Simple   No data filed    Lab Results  Component Value Date   WBC 4.6 04/29/2023   HGB 13.5 04/29/2023   HCT 41.8 04/29/2023   PLT 213.0 04/29/2023   GLUCOSE 97 04/29/2023   CHOL 262 (H) 11/19/2022   TRIG 158.0 (H) 11/19/2022   HDL 61.50 11/19/2022   LDLCALC 168 (H) 11/19/2022   ALT 25 04/29/2023   AST 17 04/29/2023   NA 138 04/29/2023   K 4.5 04/29/2023   CL 101 04/29/2023   CREATININE 0.81 04/29/2023   BUN 12 04/29/2023   CO2 29 04/29/2023   TSH 1.33 04/29/2023   HGBA1C 5.7 04/29/2023    Lab Results  Component Value Date   TSH 1.33 04/29/2023   Lab Results  Component Value Date   WBC 4.6 04/29/2023   HGB 13.5 04/29/2023  HCT 41.8 04/29/2023   MCV 88.3 04/29/2023   PLT 213.0 04/29/2023   Lab Results  Component Value Date   NA 138 04/29/2023   K 4.5 04/29/2023   CO2 29 04/29/2023   GLUCOSE 97 04/29/2023   BUN 12 04/29/2023   CREATININE 0.81 04/29/2023   BILITOT 0.5 04/29/2023   ALKPHOS 57 04/29/2023   AST 17 04/29/2023   ALT 25 04/29/2023   PROT 6.7 04/29/2023   ALBUMIN 4.2 04/29/2023   CALCIUM 9.6 04/29/2023   GFR 82.83 04/29/2023   Lab Results  Component Value Date   CHOL 262 (H) 11/19/2022   Lab Results  Component Value Date   HDL 61.50 11/19/2022   Lab Results  Component Value Date   LDLCALC 168 (H) 11/19/2022   Lab Results  Component Value Date   TRIG 158.0 (H) 11/19/2022   Lab Results  Component Value  Date   CHOLHDL 4 11/19/2022   Lab Results  Component Value Date   HGBA1C 5.7 04/29/2023       Assessment & Plan:  Hyperlipidemia, unspecified hyperlipidemia type Assessment & Plan: Encouraged heart healthy diet such as MIND or DASH diet, increase exercise, avoid trans fats, consider a krill or fish oil cap daily  Orders: -     Lipid panel -     Comprehensive metabolic panel; Future -     Lipid panel; Future -     TSH; Future  Other migraine without status migrainosus, not intractable Assessment & Plan: Infrequent and responsive to Zomig and Ibuprofen, may use them together as needed.Infrequent and responsive to Zomig and Ibuprofen, may use them together as needed.  Orders: -     TSH; Future  Gastroesophageal reflux disease, unspecified whether esophagitis present Assessment & Plan: Avoid offending foods, start probiotics. Do not eat large meals in late evening and consider raising head of bed.  Seeing gastroenterology in October. Omeprazole at 40 mg daily now down to 20 mg every other day, very few symptoms so far.   Orders: -     CBC with Differential/Platelet; Future -     TSH; Future  High risk medication use  Acute cystitis without hematuria Assessment & Plan: Cranberry tabs daily, no recent infectious symptoms     Assessment and Plan    Hyperlipidemia On rosuvastatin, lipid panel not done at last lab visit due to an oversight. Patient has been on rosuvastatin for over 90 days. -Order lipid panel today. -Order lipid panel for 6 months from today (11/24/2023).  Gastroesophageal Reflux Disease (GERD) Currently on 20mg  PPI every other day, with minimal symptoms. Discussed lifestyle modifications and potential use of a specific probiotic. -Continue current regimen. -Consider trying P[facility]ulum probiotic for 3 months.  General Health Maintenance -Flu shot not yet received, patient plans to get it at a local pharmacy due to insurance coverage issues. -Schedule  follow-up appointment and labs for 6 months from today (11/24/2023).         Danise Edge, MD

## 2023-06-18 ENCOUNTER — Encounter: Payer: Self-pay | Admitting: Gastroenterology

## 2023-06-18 ENCOUNTER — Ambulatory Visit (INDEPENDENT_AMBULATORY_CARE_PROVIDER_SITE_OTHER): Payer: Commercial Managed Care - PPO | Admitting: Gastroenterology

## 2023-06-18 VITALS — BP 112/74 | HR 76 | Ht 64.0 in | Wt 200.0 lb

## 2023-06-18 DIAGNOSIS — K219 Gastro-esophageal reflux disease without esophagitis: Secondary | ICD-10-CM

## 2023-06-18 MED ORDER — FAMOTIDINE 20 MG PO TABS: 20.0000 mg | ORAL_TABLET | Freq: Two times a day (BID) | ORAL | 3 refills | Status: DC

## 2023-06-18 NOTE — Progress Notes (Signed)
06/18/2023 Kristen Evans [id number] [birth year]   HISTORY OF PRESENT ILLNESS: This is a pleasant 52 year old female who is a patient of Dr. Marina Goodell is known to him only for colonoscopy in 2022.  She is referred back here today by her PCP, Dr. Abner Greenspan, for evaluation in regards to her reflux.  She tells me that she has had issues with reflux on and off for several years and she always took Pepcid as needed which helped.  She said earlier this year it was not helping anymore.  She started having severe reflux and it even hurt to swallow.  Her PCP placed her on omeprazole 40 mg daily, which she took for about a month and a half and her symptoms completely resolved.  Now she has been tapering off of it and is down to only 20 mg about every other day.  Still feels well.  She wanted to keep this appointment to talk through options of medications, etc.  Past Medical History:  Diagnosis Date   Allergy    seasonal    Anemia    with pregnancy   Fibrocystic breast disease    GERD (gastroesophageal reflux disease)    pregnancy and recently   Left ankle pain 03/04/2017   Lipoma of lower extremity 11/21/2011   Migraine    Migraines 11/21/2011   Obesity    Preventative health care 11/21/2011   Reflux 11/21/2011   Past Surgical History:  Procedure Laterality Date   ANTERIOR AND POSTERIOR VAGINAL REPAIR     bilateral apical suspension  06/2020   BREAST SURGERY  1987   biopsy   dental implants     perineorrhaphy     WISDOM TOOTH EXTRACTION      reports that she has never smoked. She has never used smokeless tobacco. She reports that she does not drink alcohol and does not use drugs. family history includes Arthritis in her maternal grandmother; Breast cancer in her maternal grandmother and mother; Cancer in her maternal grandmother and mother; Colon polyps in her mother; Diabetes in her father; GER disease in her daughter; Heart disease in her father, maternal grandfather, and paternal grandfather;  Hyperlipidemia in her father, maternal grandfather, and maternal grandmother; Hypertension in her mother; Other in her father; Stroke in her maternal grandfather and maternal grandmother. Allergies  Allergen Reactions   Atorvastatin    Penicillins Hives    Happened in childhood, may have spread all over the body.      Outpatient Encounter Medications as of 06/18/2023  Medication Sig   aspirin-acetaminophen-caffeine (EXCEDRIN MIGRAINE) 250-250-65 MG tablet Take 1 tablet by mouth every 6 (six) hours as needed.   Cranberry (RA CRANBERRY) 500 MG CAPS Take by mouth.   omeprazole (PRILOSEC) 40 MG capsule Take 1 capsule (40 mg total) by mouth daily.   rosuvastatin (CRESTOR) 5 MG tablet TAKE 1 TABLET (5 MG TOTAL) BY MOUTH DAILY.   ALPRAZolam (XANAX) 0.25 MG tablet Take 1 tablet (0.25 mg total) by mouth 2 (two) times daily as needed for anxiety. (Patient not taking: Reported on 06/18/2023)   fluticasone (FLONASE) 50 MCG/ACT nasal spray  (Patient not taking: Reported on 06/18/2023)   [DISCONTINUED] famotidine (PEPCID) 20 MG tablet Take 20 mg by mouth daily as needed for heartburn or indigestion.   No facility-administered encounter medications on file as of 06/18/2023.    REVIEW OF SYSTEMS  : All other systems reviewed and negative except where noted in the History of Present Illness.   PHYSICAL EXAM:  BP 112/74   Pulse 76   Ht 5\' 4"  (1.626 m)   Wt 200 lb (90.7 kg)   LMP  (LMP Unknown)   BMI 34.33 kg/m  General: Well developed white female in no acute distress Head: Normocephalic and atraumatic Eyes:  Sclerae anicteric, conjunctiva pink. Ears: Normal auditory acuity Musculoskeletal: Symmetrical with no gross deformities  Skin: No lesions on visible extremities Neurological: Alert oriented x 4, grossly non-focal Psychological:  Alert and cooperative. Normal mood and affect  ASSESSMENT AND PLAN: *GERD: Was having a lot of reflux and even some odynophagia, but everything completely  resolved after starting omeprazole 40 mg daily.  She is now down to taking 20 mg every other day and still feels great.  We discussed dietary measures and changes.  Symptoms likely to recur at some point if she completely comes off of medication.  We discussed continuing the omeprazole low-dose versus trying famotidine 20 mg twice daily.  She would like to try that option.  Prescription sent to pharmacy.  We also discussed EGD.  Patient will consider EGD especially if symptoms recur, but would like to hold off for now, which I think is reasonable as symptoms have completely resolved.   CC:  Bradd Canary, MD

## 2023-06-18 NOTE — Progress Notes (Signed)
Noted  

## 2023-06-18 NOTE — Patient Instructions (Signed)
We have sent the following medications to your pharmacy for you to pick up at your convenience: Famotidine 20 mg twice daily, continue to taper off Omeprazole.   _______________________________________________________  If your blood pressure at your visit was 140/90 or greater, please contact your primary care physician to follow up on this.  _______________________________________________________  If you are age 53 or older, your body mass index should be between 23-30. Your Body mass index is 34.33 kg/m. If this is out of the aforementioned range listed, please consider follow up with your Primary Care Provider.  If you are age 39 or younger, your body mass index should be between 19-25. Your Body mass index is 34.33 kg/m. If this is out of the aformentioned range listed, please consider follow up with your Primary Care Provider.   ________________________________________________________  The [facility] providers would like to encourage you to use [facility] to communicate with providers for non-urgent requests or questions.  Due to long hold times on the telephone, sending your provider a message by [facility] may be a faster and more efficient way to get a response.  Please allow 48 business hours for a response.  Please remember that this is for non-urgent requests.  _______________________________________________________

## 2023-09-04 ENCOUNTER — Other Ambulatory Visit: Payer: Self-pay | Admitting: Family Medicine

## 2023-10-15 ENCOUNTER — Telehealth: Payer: Commercial Managed Care - PPO | Admitting: Physician Assistant

## 2023-10-15 DIAGNOSIS — R3989 Other symptoms and signs involving the genitourinary system: Secondary | ICD-10-CM | POA: Diagnosis not present

## 2023-10-15 MED ORDER — SULFAMETHOXAZOLE-TRIMETHOPRIM 800-160 MG PO TABS
1.0000 | ORAL_TABLET | Freq: Two times a day (BID) | ORAL | 0 refills | Status: DC
Start: 2023-10-15 — End: 2024-06-25

## 2023-10-15 NOTE — Progress Notes (Signed)
I have spent 5 minutes in review of e-visit questionnaire, review and updating patient chart, medical decision making and response to patient.   Piedad Climes, PA-C

## 2023-10-15 NOTE — Progress Notes (Signed)

## 2023-11-20 ENCOUNTER — Encounter: Payer: Commercial Managed Care - PPO | Admitting: Family Medicine

## 2023-11-25 ENCOUNTER — Other Ambulatory Visit (INDEPENDENT_AMBULATORY_CARE_PROVIDER_SITE_OTHER): Payer: Commercial Managed Care - PPO

## 2023-11-25 DIAGNOSIS — K219 Gastro-esophageal reflux disease without esophagitis: Secondary | ICD-10-CM

## 2023-11-25 DIAGNOSIS — E785 Hyperlipidemia, unspecified: Secondary | ICD-10-CM

## 2023-11-25 DIAGNOSIS — G43809 Other migraine, not intractable, without status migrainosus: Secondary | ICD-10-CM

## 2023-11-25 LAB — CBC WITH DIFFERENTIAL/PLATELET
Basophils Absolute: 0 10*3/uL (ref 0.0–0.1)
Basophils Relative: 0.4 % (ref 0.0–3.0)
Eosinophils Absolute: 0.1 10*3/uL (ref 0.0–0.7)
Eosinophils Relative: 2 % (ref 0.0–5.0)
HCT: 40.7 % (ref 36.0–46.0)
Hemoglobin: 13.5 g/dL (ref 12.0–15.0)
Lymphocytes Relative: 24 % (ref 12.0–46.0)
Lymphs Abs: 1.7 10*3/uL (ref 0.7–4.0)
MCHC: 33.3 g/dL (ref 30.0–36.0)
MCV: 88.3 fl (ref 78.0–100.0)
Monocytes Absolute: 0.5 10*3/uL (ref 0.1–1.0)
Monocytes Relative: 6.9 % (ref 3.0–12.0)
Neutro Abs: 4.7 10*3/uL (ref 1.4–7.7)
Neutrophils Relative %: 66.7 % (ref 43.0–77.0)
Platelets: 212 10*3/uL (ref 150.0–400.0)
RBC: 4.61 Mil/uL (ref 3.87–5.11)
RDW: 14.2 % (ref 11.5–15.5)
WBC: 7 10*3/uL (ref 4.0–10.5)

## 2023-11-25 LAB — COMPREHENSIVE METABOLIC PANEL
ALT: 19 U/L (ref 0–35)
AST: 15 U/L (ref 0–37)
Albumin: 4.5 g/dL (ref 3.5–5.2)
Alkaline Phosphatase: 58 U/L (ref 39–117)
BUN: 9 mg/dL (ref 6–23)
CO2: 30 meq/L (ref 19–32)
Calcium: 9.3 mg/dL (ref 8.4–10.5)
Chloride: 102 meq/L (ref 96–112)
Creatinine, Ser: 0.8 mg/dL (ref 0.40–1.20)
GFR: 83.73 mL/min (ref >60.00)
Glucose, Bld: 93 mg/dL (ref 70–99)
Potassium: 5 meq/L (ref 3.5–5.1)
Sodium: 139 meq/L (ref 135–145)
Total Bilirubin: 0.4 mg/dL (ref 0.2–1.2)
Total Protein: 6.8 g/dL (ref 6.0–8.3)

## 2023-11-25 LAB — LIPID PANEL
Cholesterol: 160 mg/dL (ref 0–200)
HDL: 60.8 mg/dL (ref >39.00)
LDL Cholesterol: 83 mg/dL (ref 0–99)
NonHDL: 98.91
Total CHOL/HDL Ratio: 3
Triglycerides: 79 mg/dL (ref 0.0–149.0)
VLDL: 15.8 mg/dL (ref 0.0–40.0)

## 2023-11-25 LAB — TSH: TSH: 2.16 u[IU]/mL (ref 0.35–5.50)

## 2023-11-26 ENCOUNTER — Encounter: Payer: Self-pay | Admitting: Family Medicine

## 2023-12-03 ENCOUNTER — Encounter: Payer: Commercial Managed Care - PPO | Admitting: Family Medicine

## 2023-12-15 NOTE — Assessment & Plan Note (Signed)
 Patient encouraged to maintain heart healthy diet, regular exercise, adequate sleep. Consider daily probiotics. Take medications as prescribed. Labs reviewed and ordered. 11/2020 colonoscopy repeat in 10 years. Pap in 2023 repeat in 3-5 years MGM 03/2023 repeat annually Given and reviewed copy of ACP documents from [facility] and encouraged to complete and return

## 2023-12-15 NOTE — Assessment & Plan Note (Signed)
Encouraged increased hydration, 64 ounces of clear fluids daily. Minimize alcohol and caffeine. Eat small frequent meals with lean proteins and complex carbs. Avoid high and low blood sugars. Get adequate sleep, 7-8 hours a night. Needs exercise daily preferably in the morning.  

## 2023-12-15 NOTE — Assessment & Plan Note (Signed)
Encouraged heart healthy diet such as MIND or DASH diet, increase exercise, avoid trans fats, consider a krill or fish oil cap daily

## 2023-12-16 ENCOUNTER — Encounter: Payer: Self-pay | Admitting: Family Medicine

## 2023-12-16 ENCOUNTER — Ambulatory Visit (HOSPITAL_BASED_OUTPATIENT_CLINIC_OR_DEPARTMENT_OTHER)
Admission: RE | Admit: 2023-12-16 | Discharge: 2023-12-16 | Disposition: A | Discharge status: Home / Self Care | Source: Ambulatory Visit | Attending: Family Medicine | Admitting: Family Medicine

## 2023-12-16 ENCOUNTER — Ambulatory Visit (INDEPENDENT_AMBULATORY_CARE_PROVIDER_SITE_OTHER): Admitting: Family Medicine

## 2023-12-16 VITALS — BP 132/87 | HR 87 | Temp 98.1°F | Resp 18 | Ht 64.0 in | Wt 204.4 lb

## 2023-12-16 DIAGNOSIS — G43809 Other migraine, not intractable, without status migrainosus: Secondary | ICD-10-CM

## 2023-12-16 DIAGNOSIS — E785 Hyperlipidemia, unspecified: Secondary | ICD-10-CM

## 2023-12-16 DIAGNOSIS — M25552 Pain in left hip: Secondary | ICD-10-CM

## 2023-12-16 DIAGNOSIS — Z0000 Encounter for general adult medical examination without abnormal findings: Secondary | ICD-10-CM

## 2023-12-16 DIAGNOSIS — G8929 Other chronic pain: Secondary | ICD-10-CM | POA: Insufficient documentation

## 2023-12-16 DIAGNOSIS — M25562 Pain in left knee: Secondary | ICD-10-CM | POA: Diagnosis not present

## 2023-12-16 NOTE — Progress Notes (Signed)
 Subjective:    Patient ID: Kristen Evans, female    DOB: 1971-02-14, 53 y.o.   MRN: [id number]  Chief Complaint  Patient presents with   Annual Exam    HPI Discussed the use of AI scribe software for clinical note transcription with the patient, who gave verbal consent to proceed.  History of Present Illness Kristen Evans is a 53 year old female who presents with leg pain and concerns about weight management.  She experiences aching and sore pain in her left leg, primarily affecting the hip and knee, with occasional extension to the lower back and foot. The pain is exacerbated by physical activities such as yard work or hiking and is severe enough to wake her at night, requiring her to prop her knee with a pillow for relief. Movement slightly improves the pain, but it worsens with increased activity. Ibuprofen provides some relief, though she avoids frequent use. She is on rosuvastatin and is concerned it might contribute to her leg pain, although she acknowledges the unusual localization to one leg. The pain worsens after significant exertion, and her left leg feels less flexible than the right, complicating certain movements. No recent falls or incontinence. She denies new chest pain.  She is concerned about weight management and has attended a session with a healthy weight and wellness program, which she found beneficial, but her insurance only covers a limited number of visits per year. She acknowledges the complexity of weight management, citing genetic factors and lifestyle challenges. She is aware of the importance of hydration, sleep, and balanced nutrition, including the role of protein in managing weight and metabolism. She mentions the impact of stress on metabolism and the challenges posed by the food industry.  Regarding her menstrual history, she reports that her periods have been more frequent and heavier in the past, but her most recent cycle was more typical, occurring  every 30 days. She is aware that perimenopause can last for several years and is monitoring her symptoms accordingly.    Past Medical History:  Diagnosis Date   Allergy    seasonal    Anemia    with pregnancy   Fibrocystic breast disease    GERD (gastroesophageal reflux disease)    pregnancy and recently   Left ankle pain 03/04/2017   Lipoma of lower extremity 11/21/2011   Migraine    Migraines 11/21/2011   Obesity    Preventative health care 11/21/2011   Reflux 11/21/2011    Past Surgical History:  Procedure Laterality Date   ANTERIOR AND POSTERIOR VAGINAL REPAIR     bilateral apical suspension  06/2020   BREAST SURGERY  1987   biopsy   dental implants     perineorrhaphy     WISDOM TOOTH EXTRACTION      Family History  Problem Relation Age of Onset   Cancer Mother        breast   Hypertension Mother    Breast cancer Mother    Colon polyps Mother    Melanoma Mother    Hypertension Father    Diabetes Father    Hyperlipidemia Father    Other Father        polycythemia  vera   Heart disease Father        CAD s/p CABG x 4 vessels   Depression Sister    GER disease Daughter    Arthritis Maternal Grandmother    Cancer Maternal Grandmother        breast  Hyperlipidemia Maternal Grandmother    Stroke Maternal Grandmother    Breast cancer Maternal Grandmother    Heart disease Maternal Grandfather    Hyperlipidemia Maternal Grandfather    Stroke Maternal Grandfather    Heart disease Paternal Grandfather    Colon cancer Neg Hx    Esophageal cancer Neg Hx    Rectal cancer Neg Hx    Stomach cancer Neg Hx     Social History   Socioeconomic History   Marital status: Married    Spouse name: Not on file   Number of children: Not on file   Years of education: Not on file   Highest education level: Bachelor's degree (e.g., BA, AB, BS)  Occupational History   Not on file  Tobacco Use   Smoking status: Never   Smokeless tobacco: Never  Vaping Use   Vaping status:  Never Used  Substance and Sexual Activity   Alcohol use: No   Drug use: No   Sexual activity: Yes  Other Topics Concern   Not on file  Social History Narrative   Not on file   Social Drivers of Health   Financial Resource Strain: Low Risk  (12/15/2023)   Overall Financial Resource Strain (CARDIA)    Difficulty of Paying Living Expenses: Not hard at all  Food Insecurity: No Food Insecurity (12/15/2023)   Hunger Vital Sign    Worried About Running Out of Food in the Last Year: Never true    Ran Out of Food in the Last Year: Never true  Transportation Needs: No Transportation Needs (12/15/2023)   PRAPARE - Administrator, Civil Service (Medical): No    Lack of Transportation (Non-Medical): No  Physical Activity: Insufficiently Active (12/15/2023)   Exercise Vital Sign    Days of Exercise per Week: 3 days    Minutes of Exercise per Session: 30 min  Stress: No Stress Concern Present (12/15/2023)   [employer] of Occupational Health - Occupational Stress Questionnaire    Feeling of Stress : Only a little  Social Connections: Socially Integrated (12/15/2023)   Social Connection and Isolation Panel [NHANES]    Frequency of Communication with Friends and Family: More than three times a week    Frequency of Social Gatherings with Friends and Family: Once a week    Attends Religious Services: More than 4 times per year    Active Member of [employer] or Organizations: Yes    Attends Engineer, structural: More than 4 times per year    Marital Status: Married  Catering manager Violence: Not on file    Outpatient Medications Prior to Visit  Medication Sig Dispense Refill   ALPRAZolam (XANAX) 0.25 MG tablet Take 1 tablet (0.25 mg total) by mouth 2 (two) times daily as needed for anxiety. 10 tablet 0   aspirin-acetaminophen-caffeine (EXCEDRIN MIGRAINE) 250-250-65 MG tablet Take 1 tablet by mouth every 6 (six) hours as needed.     Cranberry (RA CRANBERRY) 500 MG CAPS Take by  mouth.     famotidine (PEPCID) 20 MG tablet Take 1 tablet (20 mg total) by mouth 2 (two) times daily. 180 tablet 3   omeprazole (PRILOSEC) 40 MG capsule Take 1 capsule (40 mg total) by mouth daily. 30 capsule 3   rosuvastatin (CRESTOR) 5 MG tablet Take 1 tablet (5 mg total) by mouth daily. 90 tablet 1   sulfamethoxazole-trimethoprim (BACTRIM DS) 800-160 MG tablet Take 1 tablet by mouth 2 (two) times daily. 10 tablet 0   fluticasone (  FLONASE) 50 MCG/ACT nasal spray  (Patient not taking: Reported on 06/18/2023)     No facility-administered medications prior to visit.    Allergies  Allergen Reactions   Atorvastatin    Penicillins Hives    Happened in childhood, may have spread all over the body.    Review of Systems  Constitutional:  Positive for malaise/fatigue. Negative for chills and fever.  HENT:  Negative for congestion and hearing loss.   Eyes:  Negative for discharge.  Respiratory:  Negative for cough, sputum production and shortness of breath.   Cardiovascular:  Negative for chest pain, palpitations and leg swelling.  Gastrointestinal:  Negative for abdominal pain, blood in stool, constipation, diarrhea, heartburn, nausea and vomiting.  Genitourinary:  Negative for dysuria, frequency, hematuria and urgency.  Musculoskeletal:  Positive for back pain, joint pain and myalgias. Negative for falls.  Skin:  Negative for rash.  Neurological:  Negative for dizziness, sensory change, loss of consciousness, weakness and headaches.  Endo/Heme/Allergies:  Negative for environmental allergies. Does not bruise/bleed easily.  Psychiatric/Behavioral:  Negative for depression and suicidal ideas. The patient is not nervous/anxious and does not have insomnia.        Objective:    Physical Exam Constitutional:      General: She is not in acute distress.    Appearance: Normal appearance. She is not diaphoretic.  HENT:     Head: Normocephalic and atraumatic.     Right Ear: Tympanic membrane,  ear canal and external ear normal.     Left Ear: Tympanic membrane, ear canal and external ear normal.     Nose: Nose normal.     Mouth/Throat:     Mouth: Mucous membranes are moist.     Pharynx: Oropharynx is clear. No oropharyngeal exudate.  Eyes:     General: No scleral icterus.       Right eye: No discharge.        Left eye: No discharge.     Conjunctiva/sclera: Conjunctivae normal.     Pupils: Pupils are equal, round, and reactive to light.  Neck:     Thyroid: No thyromegaly.  Cardiovascular:     Rate and Rhythm: Normal rate and regular rhythm.     Heart sounds: Normal heart sounds. No murmur heard. Pulmonary:     Effort: Pulmonary effort is normal. No respiratory distress.     Breath sounds: Normal breath sounds. No wheezing or rales.  Abdominal:     General: Bowel sounds are normal. There is no distension.     Palpations: Abdomen is soft. There is no mass.     Tenderness: There is no abdominal tenderness.  Musculoskeletal:        General: No tenderness. Normal range of motion.     Cervical back: Normal range of motion and neck supple.  Lymphadenopathy:     Cervical: No cervical adenopathy.  Skin:    General: Skin is warm and dry.     Findings: No rash.  Neurological:     General: No focal deficit present.     Mental Status: She is alert and oriented to person, place, and time.     Cranial Nerves: No cranial nerve deficit.     Coordination: Coordination normal.     Deep Tendon Reflexes: Reflexes are normal and symmetric. Reflexes normal.  Psychiatric:        Mood and Affect: Mood normal.        Behavior: Behavior normal.        Thought  Content: Thought content normal.        Judgment: Judgment normal.     BP 132/87 (BP Location: Left Arm, Patient Position: Sitting, Cuff Size: Normal)   Pulse 87   Temp 98.1 F (36.7 C) (Oral)   Resp 18   Ht 5\' 4"  (1.626 m)   Wt 204 lb 6.4 oz (92.7 kg)   LMP  (LMP Unknown)   SpO2 97%   BMI 35.09 kg/m  Wt Readings from Last  3 Encounters:  12/16/23 204 lb 6.4 oz (92.7 kg)  06/18/23 200 lb (90.7 kg)  05/27/23 201 lb 6.4 oz (91.4 kg)    Diabetic Foot Exam - Simple   No data filed    Lab Results  Component Value Date   WBC 7.0 11/25/2023   HGB 13.5 11/25/2023   HCT 40.7 11/25/2023   PLT 212.0 11/25/2023   GLUCOSE 93 11/25/2023   CHOL 160 11/25/2023   TRIG 79.0 11/25/2023   HDL 60.80 11/25/2023   LDLCALC 83 11/25/2023   ALT 19 11/25/2023   AST 15 11/25/2023   NA 139 11/25/2023   K 5.0 11/25/2023   CL 102 11/25/2023   CREATININE 0.80 11/25/2023   BUN 9 11/25/2023   CO2 30 11/25/2023   TSH 2.16 11/25/2023   HGBA1C 5.7 04/29/2023    Lab Results  Component Value Date   TSH 2.16 11/25/2023   Lab Results  Component Value Date   WBC 7.0 11/25/2023   HGB 13.5 11/25/2023   HCT 40.7 11/25/2023   MCV 88.3 11/25/2023   PLT 212.0 11/25/2023   Lab Results  Component Value Date   NA 139 11/25/2023   K 5.0 11/25/2023   CO2 30 11/25/2023   GLUCOSE 93 11/25/2023   BUN 9 11/25/2023   CREATININE 0.80 11/25/2023   BILITOT 0.4 11/25/2023   ALKPHOS 58 11/25/2023   AST 15 11/25/2023   ALT 19 11/25/2023   PROT 6.8 11/25/2023   ALBUMIN 4.5 11/25/2023   CALCIUM 9.3 11/25/2023   GFR 83.73 11/25/2023   Lab Results  Component Value Date   CHOL 160 11/25/2023   Lab Results  Component Value Date   HDL 60.80 11/25/2023   Lab Results  Component Value Date   LDLCALC 83 11/25/2023   Lab Results  Component Value Date   TRIG 79.0 11/25/2023   Lab Results  Component Value Date   CHOLHDL 3 11/25/2023   Lab Results  Component Value Date   HGBA1C 5.7 04/29/2023       Assessment & Plan:  Hyperlipidemia, unspecified hyperlipidemia type Assessment & Plan: Encouraged heart healthy diet such as MIND or DASH diet, increase exercise, avoid trans fats, consider a krill or fish oil cap daily   Other migraine without status migrainosus, not intractable Assessment & Plan: Encouraged increased  hydration, 64 ounces of clear fluids daily. Minimize alcohol and caffeine. Eat small frequent meals with lean proteins and complex carbs. Avoid high and low blood sugars. Get adequate sleep, 7-8 hours a night. Needs exercise daily preferably in the morning.    Preventative health care Assessment & Plan: Patient encouraged to maintain heart healthy diet, regular exercise, adequate sleep. Consider daily probiotics. Take medications as prescribed. Labs reviewed and ordered. 11/2020 colonoscopy repeat in 10 years. Pap in 2023 repeat in 3-5 years MGM 03/2023 repeat annually Given and reviewed copy of ACP documents from [facility] and encouraged to complete and return    Chronic pain of left knee -  DG Knee Complete 4 Views Left; Future -     Ambulatory referral to Sports Medicine  Left hip pain -     DG HIP UNILAT W OR W/O PELVIS 2-3 VIEWS LEFT; Future -     Ambulatory referral to Sports Medicine    Assessment and Plan Assessment & Plan Left leg pain Chronic left leg pain, particularly in the hip and knee, exacerbated by physical activity. Differential diagnosis includes musculoskeletal issues potentially related to rosuvastatin use. Discussed rotational issues affecting musculoskeletal alignment and the need for sports medicine evaluation. - Order x-rays of the left knee and hip. - Refer to sports medicine for comprehensive evaluation, including ultrasound and assessment of musculoskeletal alignment. - Consider temporary discontinuation of rosuvastatin if sports medicine evaluation does not identify a clear cause of pain.  Obesity Struggling with weight management, complicated by genetic factors and lifestyle challenges. Discussed genetics, metabolism, and the food industry's influence. Emphasized self-compassion, stress management, hydration, sleep, protein intake, and exercise. Discussed weight loss medications like Zepbound, noting benefits and potential GI side effects and  financial considerations. - Consider referral to a nutritionist for dietary guidance. - Encourage hydration, adequate sleep, and regular exercise, aiming for 8,000 steps daily. - Discuss potential use of weight loss medications if financially feasible.  Perimenopause Experiencing changes in menstrual cycle frequency and heaviness, consistent with perimenopausal symptoms. Discussed potential duration of perimenopause and dietary impact on symptoms like hot flashes. Emphasized protein's role in managing symptoms and potential for virtual consultation to discuss hormone therapy if symptoms worsen. - Monitor menstrual cycle changes and symptoms. - Consider virtual consultation for hormone therapy discussion if symptoms worsen.  General Health Maintenance Up to date with most screenings. Discussed regular skin checks due to family history of melanoma. Encouraged healthy diet and exercise for overall health benefits. - Schedule mammogram in July. - Continue annual skin checks with a dermatologist. - Ensure adequate calcium intake and regular exercise.  Goals of Care Updating wills and healthcare power of attorney. Discussed importance of healthcare proxy and backup to ensure medical decisions align with personal values and preferences. - Submit updated advanced care directive to the office once completed.  Follow-up Discussed importance of follow-up appointments to monitor health conditions and adjust treatment plans. - Schedule a six-month follow-up appointment, with the option to cancel if not needed. - Schedule a twelve-month follow-up appointment for annual physical.     Randie Bustle, MD

## 2023-12-16 NOTE — Patient Instructions (Signed)
 Preventive Care 16-55 Years Old, Female  Preventive care refers to lifestyle choices and visits with your health care provider that can promote health and wellness. Preventive care visits are also called wellness exams.  What can I expect for my preventive care visit?  Counseling  Your health care provider may ask you questions about your:  Medical history, including:  Past medical problems.  Family medical history.  Pregnancy history.  Current health, including:  Menstrual cycle.  Method of birth control.  Emotional well-being.  Home life and relationship well-being.  Sexual activity and sexual health.  Lifestyle, including:  Alcohol, nicotine or tobacco, and drug use.  Access to firearms.  Diet, exercise, and sleep habits.  Work and work Astronomer.  Sunscreen use.  Safety issues such as seatbelt and bike helmet use.  Physical exam  Your health care provider will check your:  Height and weight. These may be used to calculate your BMI (body mass index). BMI is a measurement that tells if you are at a healthy weight.  Waist circumference. This measures the distance around your waistline. This measurement also tells if you are at a healthy weight and may help predict your risk of certain diseases, such as type 2 diabetes and high blood pressure.  Heart rate and blood pressure.  Body temperature.  Skin for abnormal spots.  What immunizations do I need?    Vaccines are usually given at various ages, according to a schedule. Your health care provider will recommend vaccines for you based on your age, medical history, and lifestyle or other factors, such as travel or where you work.  What tests do I need?  Screening  Your health care provider may recommend screening tests for certain conditions. This may include:  Lipid and cholesterol levels.  Diabetes screening. This is done by checking your blood sugar (glucose) after you have not eaten for a while (fasting).  Pelvic exam and Pap test.  Hepatitis B test.  Hepatitis C  test.  HIV (human immunodeficiency virus) test.  STI (sexually transmitted infection) testing, if you are at risk.  Lung cancer screening.  Colorectal cancer screening.  Mammogram. Talk with your health care provider about when you should start having regular mammograms. This may depend on whether you have a family history of breast cancer.  BRCA-related cancer screening. This may be done if you have a family history of breast, ovarian, tubal, or peritoneal cancers.  Bone density scan. This is done to screen for osteoporosis.  Talk with your health care provider about your test results, treatment options, and if necessary, the need for more tests.  Follow these instructions at home:  Eating and drinking    Eat a diet that includes fresh fruits and vegetables, whole grains, lean protein, and low-fat dairy products.  Take vitamin and mineral supplements as recommended by your health care provider.  Do not drink alcohol if:  Your health care provider tells you not to drink.  You are pregnant, may be pregnant, or are planning to become pregnant.  If you drink alcohol:  Limit how much you have to 0-1 drink a day.  Know how much alcohol is in your drink. In the [place] one drink equals one 12 oz bottle of beer (355 mL), one 5 oz glass of wine (148 mL), or one 1 oz glass of hard liquor (44 mL).  Lifestyle  Brush your teeth every morning and night with fluoride toothpaste. Floss one time each day.  Exercise for at least  30 minutes 5 or more days each week.  Do not use any products that contain nicotine or tobacco. These products include cigarettes, chewing tobacco, and vaping devices, such as e-cigarettes. If you need help quitting, ask your health care provider.  Do not use drugs.  If you are sexually active, practice safe sex. Use a condom or other form of protection to prevent STIs.  If you do not wish to become pregnant, use a form of birth control. If you plan to become pregnant, see your health care provider for a  prepregnancy visit.  Take aspirin only as told by your health care provider. Make sure that you understand how much to take and what form to take. Work with your health care provider to find out whether it is safe and beneficial for you to take aspirin daily.  Find healthy ways to manage stress, such as:  Meditation, yoga, or listening to music.  Journaling.  Talking to a trusted person.  Spending time with friends and family.  Minimize exposure to UV radiation to reduce your risk of skin cancer.  Safety  Always wear your seat belt while driving or riding in a vehicle.  Do not drive:  If you have been drinking alcohol. Do not ride with someone who has been drinking.  When you are tired or distracted.  While texting.  If you have been using any mind-altering substances or drugs.  Wear a helmet and other protective equipment during sports activities.  If you have firearms in your house, make sure you follow all gun safety procedures.  Seek help if you have been physically or sexually abused.  What's next?  Visit your health care provider once a year for an annual wellness visit.  Ask your health care provider how often you should have your eyes and teeth checked.  Stay up to date on all vaccines.  This information is not intended to replace advice given to you by your health care provider. Make sure you discuss any questions you have with your health care provider.  Document Revised: 02/15/2021 Document Reviewed: 02/15/2021  Elsevier Patient Education  2024 [employer].

## 2023-12-22 ENCOUNTER — Encounter: Payer: Self-pay | Admitting: Family Medicine

## 2023-12-23 NOTE — Progress Notes (Signed)
 Reviewed via MyChart

## 2023-12-26 ENCOUNTER — Encounter: Payer: Commercial Managed Care - PPO | Admitting: Family Medicine

## 2023-12-30 ENCOUNTER — Encounter: Payer: Self-pay | Admitting: Family Medicine

## 2023-12-31 NOTE — Telephone Encounter (Signed)
 Copied from CRM [id number]. Topic: Clinical - Lab/Test Results >> Dec 31, 2023 12:47 PM Chuck Crater wrote: Reason for CRM: Patient is returning a call from clinic in regarding to imaging results.

## 2023-12-31 NOTE — Progress Notes (Signed)
Patient reviewed via MyChart.

## 2024-03-07 ENCOUNTER — Other Ambulatory Visit: Payer: Self-pay | Admitting: Family Medicine

## 2024-03-20 IMAGING — MR MR BREAST BILAT WO/W CM
8 of 12 series · 32 of 48 positions shown · IV contrast (gadavist)
Comparison: No previous breast MRI.

CLINICAL DATA: Breast cancer screening. LEFT-sided nipple discharge
for approximately 2 years, clear sometimes bloody in color. History
of benign LEFT breast biopsy in April 2021 with pathology result of
fibrocystic changes and PASH.

EXAM:
BILATERAL BREAST MRI WITH AND WITHOUT CONTRAST
TECHNIQUE: Multiplanar, multisequence MR images of both breasts were obtained
prior to and following the intravenous administration of 10 ml of
Gadavist

[Series 2: t2_tirm_tra ipat (a-p) · axial · 3.0mm · 0.70mm/px · 1 of 55 slices shown]
[im 1/55]
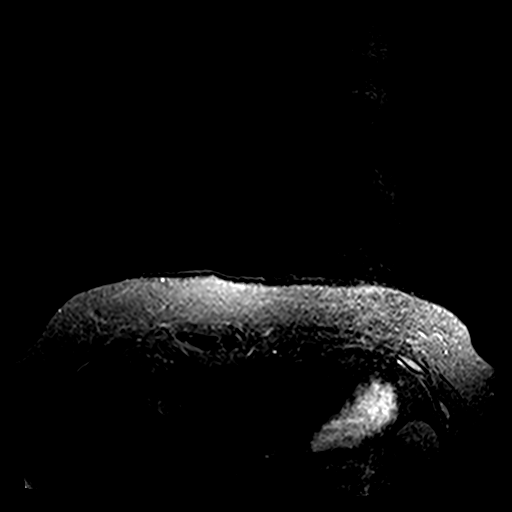

[Series 3: fl3d pre-cm no · axial · non-contrast · 1.2mm · 0.89mm/px · z∈[-66,+106]mm · 5 of 144 slices shown]
[im 1/144]
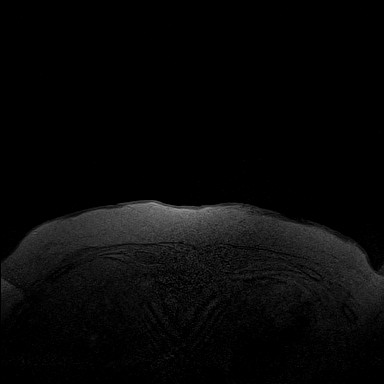
[im 36/144]
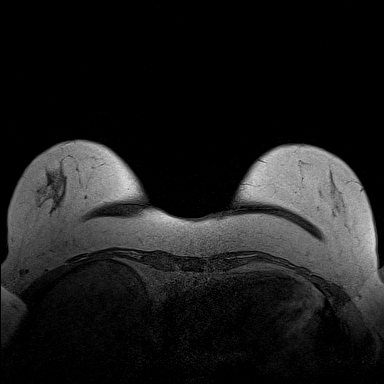
[im 72/144]
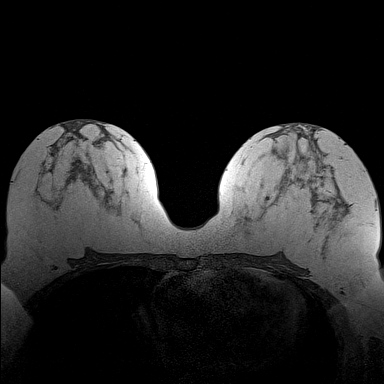
[im 108/144]
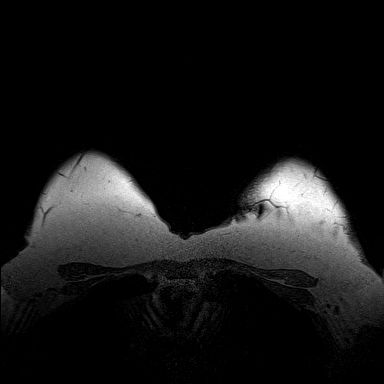
[im 144/144]
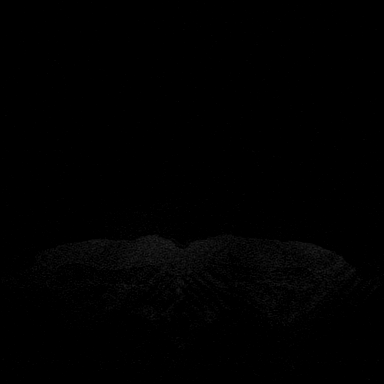

[Series 4: fl3d pre-cm · axial · non-contrast · 1.2mm · 0.89mm/px · z∈[-66,+106]mm · 5 of 144 slices shown]
[im 1/144]
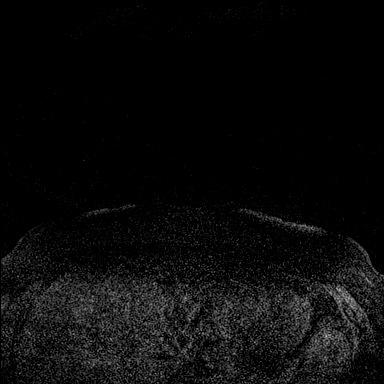
[im 36/144]
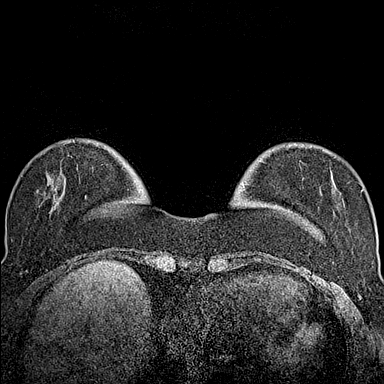
[im 72/144]
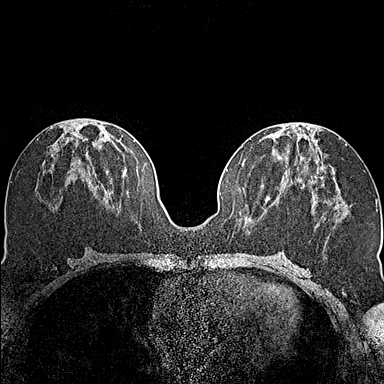
[im 108/144]
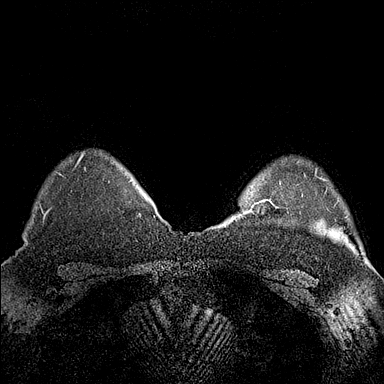
[im 144/144]
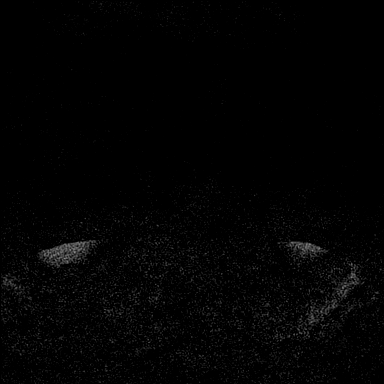

[Series 5: fl3d post-cm 20 · axial · 1.2mm · 0.89mm/px · z∈[-66,+106]mm · 5 of 144 slices shown (1 of 3)]
[im 1/144]
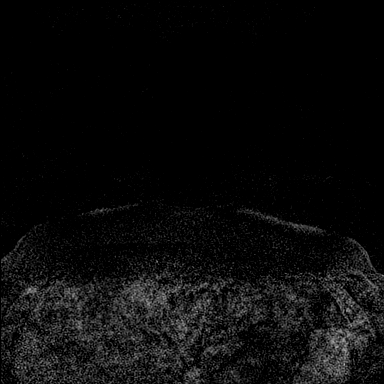
[im 36/144]
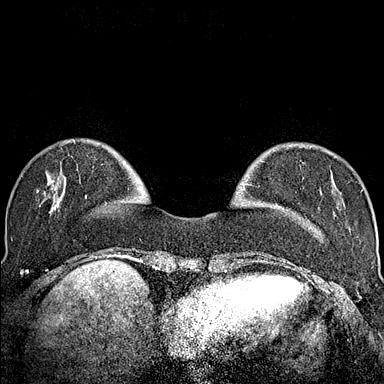
[im 72/144]
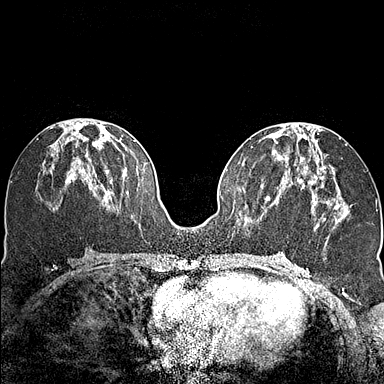
[im 108/144]
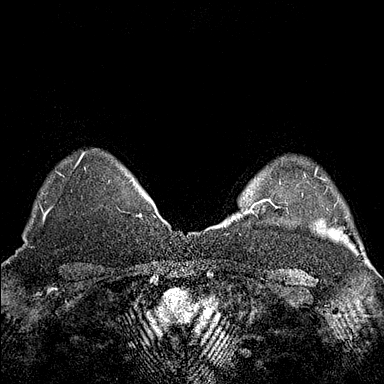
[im 144/144]
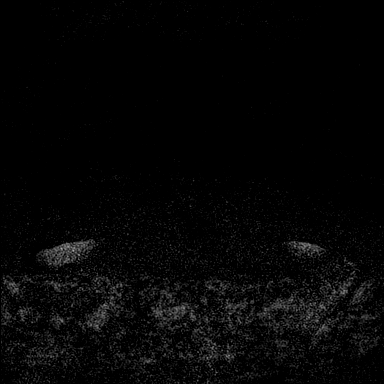

[Series 6: fl3d post-cm 20 · axial · 1.2mm · 0.89mm/px · z∈[-66,+106]mm · 5 of 144 slices shown (2 of 3)]
[im 1/144]
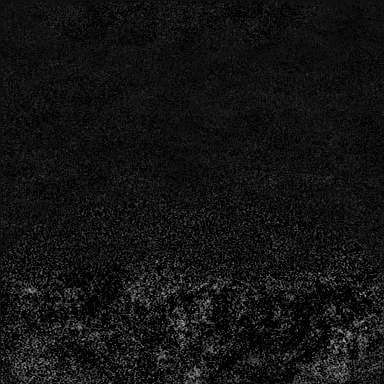
[im 36/144]
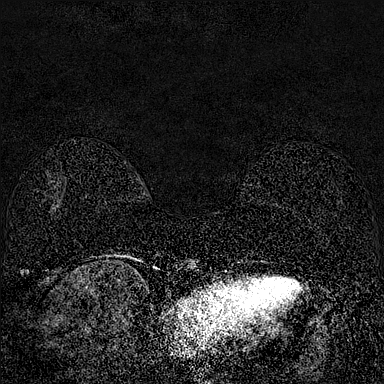
[im 72/144]
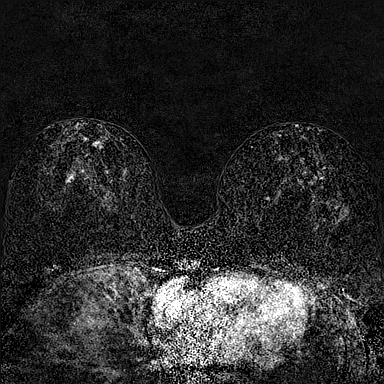
[im 108/144]
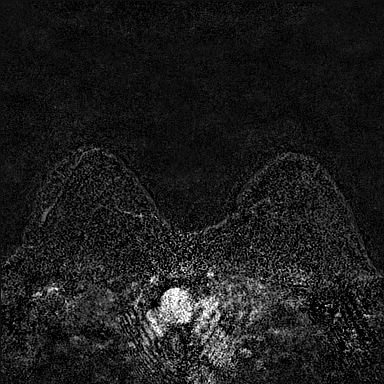
[im 144/144]
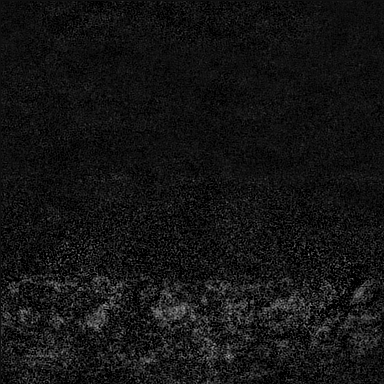

[Series 7: fl3d post-cm 20 · axial · 172.8mm · 0.89mm/px · 1 of 1 slices shown (3 of 3)]
[im 1/1]
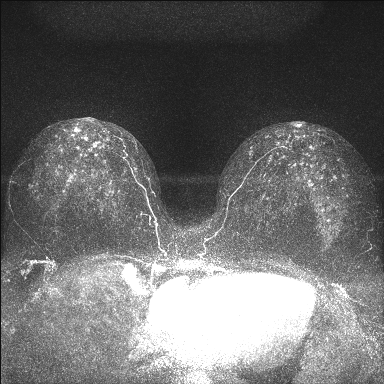

[Series 8: fl3d post-cm 3 · axial · 1.2mm · 0.89mm/px · z∈[-66,+106]mm · 6 of 144 slices shown (1 of 2)]
[im 1/144]
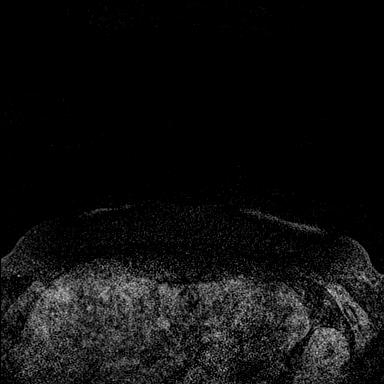
[im 29/144]
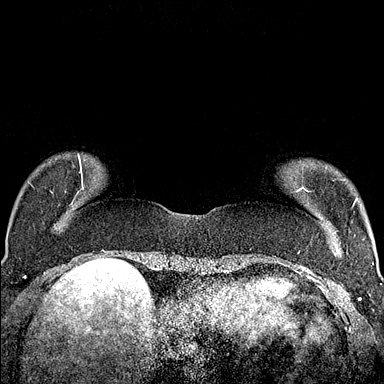
[im 58/144]
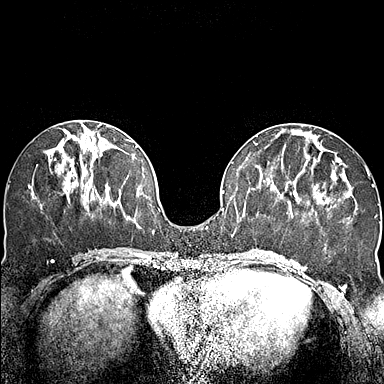
[im 86/144]
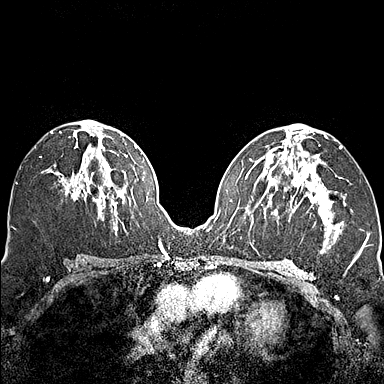
[im 115/144]
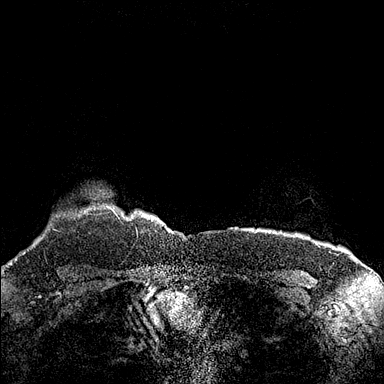
[im 144/144]
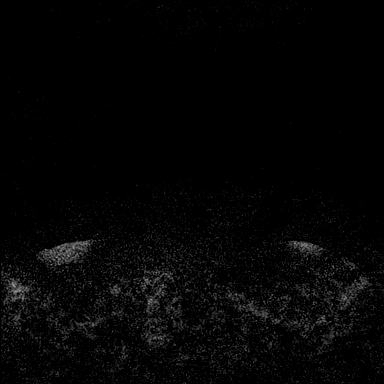

[Series 9: fl3d post-cm 3 · axial · 1.2mm · 0.89mm/px · z∈[-66,+36]mm · 4 of 144 slices shown (2 of 2)]
[im 1/144]
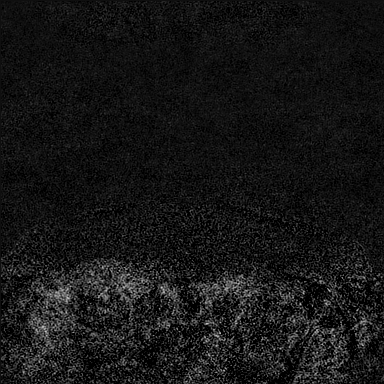
[im 29/144]
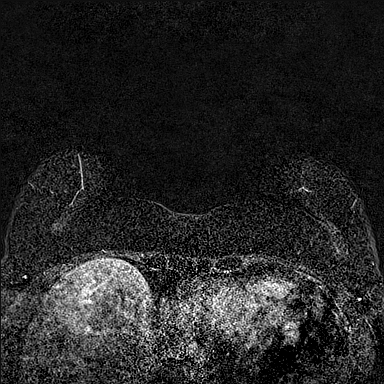
[im 58/144]
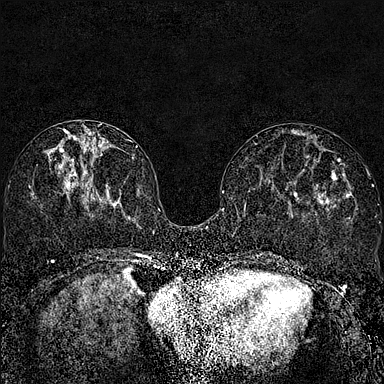
[im 86/144]
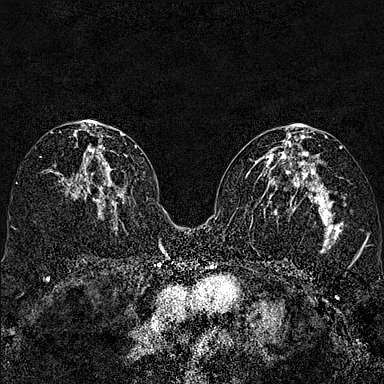

[32 of 48 positions shown; findings below may reference images not displayed]

Three-dimensional MR images were rendered by post-processing of the
original MR data on an independent workstation. The
three-dimensional MR images were interpreted, and findings are
reported in the following complete MRI report for this study. Three
dimensional images were evaluated at the independent interpreting
workstation using the DynaCAD thin client.
FINDINGS: Breast composition: c. Heterogeneous fibroglandular tissue.

Background parenchymal enhancement: Moderate-to-marked

Right breast: No suspicious enhancing mass, suspicious non-mass
enhancement or secondary signs of malignancy is identified within
the RIGHT breast.

Left breast: No suspicious enhancing mass, suspicious non-mass
enhancement or secondary signs of malignancy is identified within
the LEFT breast. Biopsy clip artifact within the subareolar LEFT
breast, corresponding to the earlier benign biopsy site.

Lymph nodes: No abnormal appearing lymph nodes.

Ancillary findings:  None.
IMPRESSION: 1. No evidence of malignancy within either breast.
2. Causes of unilateral nipple discharge include: Hormonal changes,
fibrocystic changes, benign papilloma, abscess/mastitis, birth
control pills, endocrine disorders, injury/trauma to breast, duct
ectasia, medications, prolactinoma, and breast cancer. As is evident
from this list, nipple discharge often stems from a benign
condition, however, breast cancer is a possibility when unilateral
spontaneous persistent single duct discharge (especially bloody or
clear discharge) is present.

RECOMMENDATION:
1. Clinical follow-up for the suspicious LEFT-sided nipple
discharge. Consider surgical consultation for possible central duct
excision.
2. Annual screening mammograms. Next bilateral screening mammogram
will be due in Thursday March, 2022.

BI-RADS CATEGORY  1: Negative.

## 2024-03-22 ENCOUNTER — Other Ambulatory Visit: Payer: Self-pay | Admitting: Gastroenterology

## 2024-03-26 LAB — HM MAMMOGRAPHY

## 2024-06-21 NOTE — Progress Notes (Signed)
 Subjective:    Patient ID: Kristen Evans, female    DOB: 1971/09/01, 53 y.o.   MRN: [id number]  No chief complaint on file.   HPI Discussed the use of AI scribe software for clinical note transcription with the patient, who gave verbal consent to proceed.  History of Present Illness Kristen Evans is a 53 year old female who presents for evaluation of elevated lifetime risk for breast cancer and shortness of breath.  She is concerned about her elevated lifetime risk for breast cancer. A recent mammogram indicated a high risk, defined as 20% or more. The patient reports that breast density and family history were mentioned as factors. Her maternal grandmother and mother both had breast cancer, although her mother tested negative for the BRCA gene. She is considering further genetic testing through a national study called GeneConnects.  She experiences shortness of breath, particularly when going uphill, although she remains active, having completed a six-mile hike recently. She is overweight and is concerned whether her symptoms are solely due to her weight or if there is another underlying cause. No palpitations, chest pain, sweating, or nausea associated with the shortness of breath. Her heart sometimes feels like it's beating harder, especially after eating, but not faster.  She has been experiencing left heel pain since July, which worsens with activity. She has tried different shoes, wearing shoes indoors, and icing, but the pain persists. She is concerned about this pain affecting her ability to walk the [facility] in April and May.  Her family history includes a father who had a quadruple bypass in his 71s, and cousins on her father's side with heart issues, including some who have died from heart attacks. Some family members have elevated LPA cholesterol levels.  She is considering starting a GLP-1 medication for weight management but is concerned about the cost.    Past  Medical History:  Diagnosis Date   Allergy    seasonal    Anemia    with pregnancy   Fibrocystic breast disease    GERD (gastroesophageal reflux disease)    pregnancy and recently   Left ankle pain 03/04/2017   Lipoma of lower extremity 11/21/2011   Migraine    Migraines 11/21/2011   Obesity    Preventative health care 11/21/2011   Reflux 11/21/2011    Past Surgical History:  Procedure Laterality Date   ANTERIOR AND POSTERIOR VAGINAL REPAIR     bilateral apical suspension  06/2020   BREAST SURGERY  1987   biopsy   dental implants     perineorrhaphy     WISDOM TOOTH EXTRACTION      Family History  Problem Relation Age of Onset   Cancer Mother        breast   Hypertension Mother    Breast cancer Mother    Colon polyps Mother    Melanoma Mother    Hypertension Father    Diabetes Father    Hyperlipidemia Father    Other Father        polycythemia  vera   Heart disease Father        CAD s/p CABG x 4 vessels   Depression Sister    GER disease Daughter    Arthritis Maternal Grandmother    Cancer Maternal Grandmother        breast   Hyperlipidemia Maternal Grandmother    Stroke Maternal Grandmother    Breast cancer Maternal Grandmother    Heart disease Maternal Grandfather  Hyperlipidemia Maternal Grandfather    Stroke Maternal Grandfather    Heart disease Paternal Grandfather    Colon cancer Neg Hx    Esophageal cancer Neg Hx    Rectal cancer Neg Hx    Stomach cancer Neg Hx     Social History   Socioeconomic History   Marital status: Married    Spouse name: Not on file   Number of children: Not on file   Years of education: Not on file   Highest education level: Bachelor's degree (e.g., BA, AB, BS)  Occupational History   Not on file  Tobacco Use   Smoking status: Never   Smokeless tobacco: Never  Vaping Use   Vaping status: Never Used  Substance and Sexual Activity   Alcohol use: No   Drug use: No   Sexual activity: Yes  Other Topics Concern    Not on file  Social History Narrative   Not on file   Social Drivers of Health   Financial Resource Strain: Low Risk  (12/15/2023)   Overall Financial Resource Strain (CARDIA)    Difficulty of Paying Living Expenses: Not hard at all  Food Insecurity: No Food Insecurity (12/15/2023)   Hunger Vital Sign    Worried About Running Out of Food in the Last Year: Never true    Ran Out of Food in the Last Year: Never true  Transportation Needs: No Transportation Needs (12/15/2023)   PRAPARE - Administrator, Civil Service (Medical): No    Lack of Transportation (Non-Medical): No  Physical Activity: Insufficiently Active (12/15/2023)   Exercise Vital Sign    Days of Exercise per Week: 3 days    Minutes of Exercise per Session: 30 min  Stress: No Stress Concern Present (12/15/2023)   [employer] of Occupational Health - Occupational Stress Questionnaire    Feeling of Stress : Only a little  Social Connections: Socially Integrated (12/15/2023)   Social Connection and Isolation Panel    Frequency of Communication with Friends and Family: More than three times a week    Frequency of Social Gatherings with Friends and Family: Once a week    Attends Religious Services: More than 4 times per year    Active Member of [employer] or Organizations: Yes    Attends Engineer, Structural: More than 4 times per year    Marital Status: Married  Catering Manager Violence: Not on file    Outpatient Medications Prior to Visit  Medication Sig Dispense Refill   ALPRAZolam  (XANAX ) 0.25 MG tablet Take 1 tablet (0.25 mg total) by mouth 2 (two) times daily as needed for anxiety. 10 tablet 0   aspirin-acetaminophen-caffeine (EXCEDRIN MIGRAINE) 250-250-65 MG tablet Take 1 tablet by mouth every 6 (six) hours as needed.     Cranberry (RA CRANBERRY) 500 MG CAPS Take by mouth.     famotidine  (PEPCID ) 20 MG tablet TAKE 1 TABLET BY MOUTH TWICE A DAY 180 tablet 3   omeprazole  (PRILOSEC) 40 MG capsule Take  1 capsule (40 mg total) by mouth daily. 30 capsule 3   rosuvastatin  (CRESTOR ) 5 MG tablet TAKE 1 TABLET (5 MG TOTAL) BY MOUTH DAILY. 30 tablet 5   sulfamethoxazole -trimethoprim  (BACTRIM  DS) 800-160 MG tablet Take 1 tablet by mouth 2 (two) times daily. 10 tablet 0   No facility-administered medications prior to visit.    Allergies  Allergen Reactions   Atorvastatin     Penicillins Hives    Happened in childhood, may have spread all over  the body.    Review of Systems  Constitutional:  Negative for fever and malaise/fatigue.  HENT:  Negative for congestion.   Eyes:  Negative for blurred vision.  Respiratory:  Positive for shortness of breath.   Cardiovascular:  Negative for chest pain, palpitations and leg swelling.  Gastrointestinal:  Negative for abdominal pain, blood in stool and nausea.  Genitourinary:  Negative for dysuria and frequency.  Musculoskeletal:  Positive for joint pain. Negative for falls.  Skin:  Negative for rash.  Neurological:  Negative for dizziness, loss of consciousness and headaches.  Endo/Heme/Allergies:  Negative for environmental allergies.  Psychiatric/Behavioral:  Negative for depression. The patient is nervous/anxious.        Objective:    Physical Exam Constitutional:      General: She is not in acute distress.    Appearance: Normal appearance. She is well-developed. She is not toxic-appearing.  HENT:     Head: Normocephalic and atraumatic.     Right Ear: External ear normal.     Left Ear: External ear normal.     Nose: Nose normal.  Eyes:     General:        Right eye: No discharge.        Left eye: No discharge.     Conjunctiva/sclera: Conjunctivae normal.  Neck:     Thyroid: No thyromegaly.  Cardiovascular:     Rate and Rhythm: Normal rate and regular rhythm.     Heart sounds: Normal heart sounds. No murmur heard. Pulmonary:     Effort: Pulmonary effort is normal. No respiratory distress.     Breath sounds: Normal breath sounds.   Abdominal:     General: Bowel sounds are normal.     Palpations: Abdomen is soft.     Tenderness: There is no abdominal tenderness. There is no guarding.  Musculoskeletal:        General: Normal range of motion.     Cervical back: Neck supple.  Lymphadenopathy:     Cervical: No cervical adenopathy.  Skin:    General: Skin is warm and dry.  Neurological:     Mental Status: She is alert and oriented to person, place, and time.  Psychiatric:        Mood and Affect: Mood normal.        Behavior: Behavior normal.        Thought Content: Thought content normal.        Judgment: Judgment normal.     There were no vitals taken for this visit. Wt Readings from Last 3 Encounters:  12/16/23 204 lb 6.4 oz (92.7 kg)  06/18/23 200 lb (90.7 kg)  05/27/23 201 lb 6.4 oz (91.4 kg)    Diabetic Foot Exam - Simple   No data filed    Lab Results  Component Value Date   WBC 7.0 11/25/2023   HGB 13.5 11/25/2023   HCT 40.7 11/25/2023   PLT 212.0 11/25/2023   GLUCOSE 93 11/25/2023   CHOL 160 11/25/2023   TRIG 79.0 11/25/2023   HDL 60.80 11/25/2023   LDLCALC 83 11/25/2023   ALT 19 11/25/2023   AST 15 11/25/2023   NA 139 11/25/2023   K 5.0 11/25/2023   CL 102 11/25/2023   CREATININE 0.80 11/25/2023   BUN 9 11/25/2023   CO2 30 11/25/2023   TSH 2.16 11/25/2023   HGBA1C 5.7 04/29/2023    Lab Results  Component Value Date   TSH 2.16 11/25/2023   Lab Results  Component Value  Date   WBC 7.0 11/25/2023   HGB 13.5 11/25/2023   HCT 40.7 11/25/2023   MCV 88.3 11/25/2023   PLT 212.0 11/25/2023   Lab Results  Component Value Date   NA 139 11/25/2023   K 5.0 11/25/2023   CO2 30 11/25/2023   GLUCOSE 93 11/25/2023   BUN 9 11/25/2023   CREATININE 0.80 11/25/2023   BILITOT 0.4 11/25/2023   ALKPHOS 58 11/25/2023   AST 15 11/25/2023   ALT 19 11/25/2023   PROT 6.8 11/25/2023   ALBUMIN 4.5 11/25/2023   CALCIUM  9.3 11/25/2023   GFR 83.73 11/25/2023   Lab Results  Component Value  Date   CHOL 160 11/25/2023   Lab Results  Component Value Date   HDL 60.80 11/25/2023   Lab Results  Component Value Date   LDLCALC 83 11/25/2023   Lab Results  Component Value Date   TRIG 79.0 11/25/2023   Lab Results  Component Value Date   CHOLHDL 3 11/25/2023   Lab Results  Component Value Date   HGBA1C 5.7 04/29/2023       Assessment & Plan:  Gastroesophageal reflux disease, unspecified whether esophagitis present Assessment & Plan: Avoid offending foods, start probiotics. Do not eat large meals in late evening and consider raising head of bed.  Seeing gastroenterology    Hyperlipidemia, unspecified hyperlipidemia type Assessment & Plan: Encouraged heart healthy diet such as MIND or DASH diet, increase exercise, avoid trans fats, consider a krill or fish oil cap daily   Other migraine without status migrainosus, not intractable Assessment & Plan: Encouraged increased hydration, 64 ounces of clear fluids daily. Minimize alcohol and caffeine. Eat small frequent meals with lean proteins and complex carbs. Avoid high and low blood sugars. Get adequate sleep, 7-8 hours a night. Needs exercise daily preferably in the morning.      Assessment and Plan Assessment & Plan Exertional shortness of breath Exertional shortness of breath, particularly noticeable when climbing hills. Differential includes weight-related issues, age-related changes, and potential cardiac or pulmonary causes. Family history of heart disease with some relatives having early heart issues. - Order EKG to assess electrical activity of the heart. - Order coronary artery calcium  score test for $99 to assess for arterial plaques. - Consider referral to pulmonology for further evaluation if needed. - Discuss potential use of CardiaMobile for home monitoring of palpitations. - Check with insurance about coverage for LPA and EPO B tests.  Obesity Obesity management discussed with consideration of GLP-1  receptor agonist therapy. Concerns about affordability and insurance coverage for medications like Zepbound. Discussed potential benefits of GLP-1 medications in weight management and metabolic health. - Consider starting Zepbound at $400-$500 per month, cash pay. - Monitor for side effects such as upset stomach, constipation, diarrhea, and nausea. - Adjust dosage based on response and tolerance.  Hyperlipidemia Hyperlipidemia management discussed in the context of family history of heart disease and potential genetic factors like LPA and EPO B. Current cholesterol levels are well-managed. Discussed the potential for more aggressive management if genetic factors are confirmed. - Monitor cholesterol levels regularly. - Consider LPA and EPO B testing if insurance coverage is confirmed.  Elevated lifetime risk for breast cancer Elevated lifetime risk for breast cancer due to family history (maternal grandmother and mother) and dense breasts. Mother tested negative for BRCA gene. Discussed the potential benefit of participating in the GeneConnects study for future genetic insights. Consideration of breast MRI in addition to mammogram, but insurance coverage may be an issue. -  Refer to [facility] for genetic evaluation and risk assessment. - Discuss GeneConnects study participation for genetic mapping. - Consider breast MRI in addition to mammogram, pending insurance approval.  Left heel pain, likely plantar fasciitis Chronic left heel pain since July, likely plantar fasciitis. Pain worsens with activity and throughout the day. Previous conservative management with stretching, icing, and topical treatments has been insufficient. - Refer to Dr. Sikora, podiatrist, for evaluation and management. - Advise daily stretching of the calf and foot, icing, and use of Voltaren gel. - Consider specialized orthotics and potential corticosteroid injections if needed.  Recording duration: 42  minutes     Harlene Horton, MD

## 2024-06-21 NOTE — Assessment & Plan Note (Signed)
Encouraged heart healthy diet such as MIND or DASH diet, increase exercise, avoid trans fats, consider a krill or fish oil cap daily

## 2024-06-21 NOTE — Assessment & Plan Note (Addendum)
 Avoid offending foods, start probiotics. Do not eat large meals in late evening and consider raising head of bed.  Seeing gastroenterology

## 2024-06-21 NOTE — Assessment & Plan Note (Signed)
 Encouraged increased hydration, 64 ounces of clear fluids daily. Minimize alcohol and caffeine. Eat small frequent meals with lean proteins and complex carbs. Avoid high and low blood sugars. Get adequate sleep, 7-8 hours a night. Needs exercise daily preferably in the morning.

## 2024-06-25 ENCOUNTER — Ambulatory Visit: Admitting: Family Medicine

## 2024-06-25 VITALS — BP 116/76 | HR 80 | Temp 98.3°F | Resp 12 | Ht 64.0 in | Wt 205.8 lb

## 2024-06-25 DIAGNOSIS — Z9189 Other specified personal risk factors, not elsewhere classified: Secondary | ICD-10-CM | POA: Diagnosis not present

## 2024-06-25 DIAGNOSIS — Z803 Family history of malignant neoplasm of breast: Secondary | ICD-10-CM

## 2024-06-25 DIAGNOSIS — G43809 Other migraine, not intractable, without status migrainosus: Secondary | ICD-10-CM

## 2024-06-25 DIAGNOSIS — M79672 Pain in left foot: Secondary | ICD-10-CM

## 2024-06-25 DIAGNOSIS — R0602 Shortness of breath: Secondary | ICD-10-CM

## 2024-06-25 DIAGNOSIS — E785 Hyperlipidemia, unspecified: Secondary | ICD-10-CM

## 2024-06-25 DIAGNOSIS — R0600 Dyspnea, unspecified: Secondary | ICD-10-CM

## 2024-06-25 DIAGNOSIS — K219 Gastro-esophageal reflux disease without esophagitis: Secondary | ICD-10-CM

## 2024-06-25 DIAGNOSIS — G8929 Other chronic pain: Secondary | ICD-10-CM

## 2024-06-25 NOTE — Patient Instructions (Addendum)
 LPa and EpoB are for your evaluating cholesterol and hear health ask insurance if they will cover them [employer] mobile

## 2024-06-26 ENCOUNTER — Ambulatory Visit: Payer: Self-pay | Admitting: Family Medicine

## 2024-06-28 ENCOUNTER — Encounter: Payer: Self-pay | Admitting: Family Medicine

## 2024-07-02 ENCOUNTER — Ambulatory Visit

## 2024-07-02 ENCOUNTER — Ambulatory Visit (INDEPENDENT_AMBULATORY_CARE_PROVIDER_SITE_OTHER): Admitting: Podiatry

## 2024-07-02 DIAGNOSIS — M79672 Pain in left foot: Secondary | ICD-10-CM | POA: Diagnosis not present

## 2024-07-02 DIAGNOSIS — M722 Plantar fascial fibromatosis: Secondary | ICD-10-CM | POA: Diagnosis not present

## 2024-07-02 DIAGNOSIS — G8929 Other chronic pain: Secondary | ICD-10-CM

## 2024-07-02 MED ORDER — MELOXICAM 15 MG PO TABS: 15.0000 mg | ORAL_TABLET | Freq: Every day | ORAL | 0 refills | Status: AC

## 2024-07-02 NOTE — Patient Instructions (Signed)

## 2024-07-02 NOTE — Progress Notes (Signed)
  Subjective:  Patient ID: Kristen Evans, female    DOB: 1971/05/12,   MRN: [id number]  Chief Complaint  Patient presents with   Foot Pain    L heel pain mostly medial since June 2025.  No incident. Not diabetic no anti coag    53 y.o. female presents for concern of left heel pain that has been ongoing since June.  Denies any injury.  Relates both heels were hurting at the time the right one has resolved.  She relates she is seeing her PCP and was given exercises to try however not noticing much of a difference yet.  She has tried some ice as well.  She relates most pain later on in the day or after she has been sitting for a while and gets up from rest.. Denies any other pedal complaints. Denies n/v/f/c.   Past Medical History:  Diagnosis Date   Allergy    seasonal    Anemia    with pregnancy   Fibrocystic breast disease    GERD (gastroesophageal reflux disease)    pregnancy and recently   Left ankle pain 03/04/2017   Lipoma of lower extremity 11/21/2011   Migraine    Migraines 11/21/2011   Obesity    Preventative health care 11/21/2011   Reflux 11/21/2011    Objective:  Physical Exam: Vascular: DP/PT pulses 2/4 bilateral. CFT <3 seconds. Normal hair growth on digits. No edema.  Skin. No lacerations or abrasions bilateral feet.  Musculoskeletal: MMT 5/5 bilateral lower extremities in DF, PF, Inversion and Eversion. Deceased ROM in DF of ankle joint. Tender to the medial calcaneal tubercle left . No pain with achilles, PT or arch. No pain with calcaneal squeeze.  Neurological: Sensation intact to light touch.   Assessment:   1. Plantar fasciitis, left      Plan:  Patient was evaluated and treated and all questions answered. Discussed plantar fasciitis with patient.  X-rays reviewed and discussed with patient. No acute fractures or dislocations noted. Mild spurring noted at inferior calcaneus.  Discussed treatment options including, ice, NSAIDS, supportive shoes, bracing, and  stretching. Stretching exercises provided to be done on a daily basis.   Prescription for meloxicam  provided and sent to pharmacy.  Kidney function labs reviewed and within normal limits Plantar fascia brace dispensed Follow-up 6 weeks or sooner if any problems arise. In the meantime, encouraged to call the office with any questions, concerns, change in symptoms.     Asberry Failing, DPM

## 2024-07-09 ENCOUNTER — Ambulatory Visit (HOSPITAL_BASED_OUTPATIENT_CLINIC_OR_DEPARTMENT_OTHER)
Admission: RE | Admit: 2024-07-09 | Discharge: 2024-07-09 | Disposition: A | Discharge status: Home / Self Care | Payer: Self-pay | Source: Ambulatory Visit | Attending: Family Medicine | Admitting: Family Medicine

## 2024-07-09 DIAGNOSIS — R0600 Dyspnea, unspecified: Secondary | ICD-10-CM

## 2024-07-17 ENCOUNTER — Other Ambulatory Visit: Payer: Self-pay | Admitting: Medical Genetics

## 2024-07-20 ENCOUNTER — Inpatient Hospital Stay

## 2024-07-20 ENCOUNTER — Inpatient Hospital Stay: Attending: Hematology and Oncology | Admitting: Hematology and Oncology

## 2024-07-20 VITALS — BP 128/76 | HR 78 | Temp 98.4°F | Resp 18 | Ht 64.0 in | Wt 205.0 lb

## 2024-07-20 DIAGNOSIS — N6019 Diffuse cystic mastopathy of unspecified breast: Secondary | ICD-10-CM | POA: Insufficient documentation

## 2024-07-20 DIAGNOSIS — Z1379 Encounter for other screening for genetic and chromosomal anomalies: Secondary | ICD-10-CM | POA: Diagnosis not present

## 2024-07-20 DIAGNOSIS — Z803 Family history of malignant neoplasm of breast: Secondary | ICD-10-CM | POA: Insufficient documentation

## 2024-07-20 DIAGNOSIS — Z9189 Other specified personal risk factors, not elsewhere classified: Secondary | ICD-10-CM | POA: Insufficient documentation

## 2024-07-20 NOTE — Progress Notes (Signed)
 [facility] CONSULT NOTE  Patient Care Team: Domenica Harlene LABOR, MD as PCP - General (Family Medicine)  CHIEF COMPLAINTS/PURPOSE OF CONSULTATION:  At high risk for breast cancer  HISTORY OF PRESENTING ILLNESS:    History of Present Illness Kristen Evans is a 53 year old female who presents for risk assessment and surveillance due to a family history of breast cancer. She was referred by Dr. Carleton due to her family history of breast cancer.  Her family history is significant for breast cancer, with her mother diagnosed at age 22 and her maternal grandmother in her upper sixties, who later had bone metastases and passed away at age 84 due to complications. She has not undergone BRCA testing, although her mother tested negative.  She has experienced breast symptoms, including recent unilateral breast leaking. Previous imaging with ultrasounds and MRIs showed no malignancy, and the leaking has mostly resolved.     I reviewed her records extensively and collaborated the history with the patient.  SUMMARY OF ONCOLOGIC HISTORY: Oncology History   No history exists.     MEDICAL HISTORY:  Past Medical History:  Diagnosis Date   Allergy    seasonal    Anemia    with pregnancy   Fibrocystic breast disease    GERD (gastroesophageal reflux disease)    pregnancy and recently   Left ankle pain 03/04/2017   Lipoma of lower extremity 11/21/2011   Migraine    Migraines 11/21/2011   Obesity    Preventative health care 11/21/2011   Reflux 11/21/2011    SURGICAL HISTORY: Past Surgical History:  Procedure Laterality Date   ANTERIOR AND POSTERIOR VAGINAL REPAIR     bilateral apical suspension  06/2020   BREAST SURGERY  1987   biopsy   dental implants     perineorrhaphy     WISDOM TOOTH EXTRACTION      SOCIAL HISTORY: Social History   Socioeconomic History   Marital status: Married    Spouse name: Not on file   Number of children: Not on file   Years of education:  Not on file   Highest education level: Bachelor's degree (e.g., BA, AB, BS)  Occupational History   Not on file  Tobacco Use   Smoking status: Never   Smokeless tobacco: Never  Vaping Use   Vaping status: Never Used  Substance and Sexual Activity   Alcohol use: No   Drug use: No   Sexual activity: Yes  Other Topics Concern   Not on file  Social History Narrative   Not on file   Social Drivers of Health   Financial Resource Strain: Low Risk  (06/24/2024)   Overall Financial Resource Strain (CARDIA)    Difficulty of Paying Living Expenses: Not very hard  Food Insecurity: No Food Insecurity (07/17/2024)   Hunger Vital Sign    Worried About Running Out of Food in the Last Year: Never true    Ran Out of Food in the Last Year: Never true  Transportation Needs: No Transportation Needs (07/17/2024)   PRAPARE - Administrator, Civil Service (Medical): No    Lack of Transportation (Non-Medical): No  Physical Activity: Insufficiently Active (06/24/2024)   Exercise Vital Sign    Days of Exercise per Week: 3 days    Minutes of Exercise per Session: 30 min  Stress: No Stress Concern Present (06/24/2024)   [employer] of Occupational Health - Occupational Stress Questionnaire    Feeling of Stress: Only a  little  Social Connections: Socially Integrated (06/24/2024)   Social Connection and Isolation Panel    Frequency of Communication with Friends and Family: More than three times a week    Frequency of Social Gatherings with Friends and Family: Twice a week    Attends Religious Services: More than 4 times per year    Active Member of [employer] or Organizations: Yes    Attends Engineer, Structural: More than 4 times per year    Marital Status: Married  Catering Manager Violence: Not on file    FAMILY HISTORY: Family History  Problem Relation Age of Onset   Cancer Mother        breast   Hypertension Mother    Breast cancer Mother    Colon polyps Mother     Melanoma Mother    Hypertension Father    Diabetes Father    Hyperlipidemia Father    Other Father        polycythemia  vera   Heart disease Father        CAD s/p CABG x 4 vessels   Depression Sister    GER disease Daughter    Arthritis Maternal Grandmother    Cancer Maternal Grandmother        breast   Hyperlipidemia Maternal Grandmother    Stroke Maternal Grandmother    Breast cancer Maternal Grandmother    Heart disease Maternal Grandfather    Hyperlipidemia Maternal Grandfather    Stroke Maternal Grandfather    Heart disease Paternal Grandfather    Colon cancer Neg Hx    Esophageal cancer Neg Hx    Rectal cancer Neg Hx    Stomach cancer Neg Hx     ALLERGIES:  is allergic to atorvastatin  and penicillins.  MEDICATIONS:  Current Outpatient Medications  Medication Sig Dispense Refill   ALPRAZolam  (XANAX ) 0.25 MG tablet Take 1 tablet (0.25 mg total) by mouth 2 (two) times daily as needed for anxiety. 10 tablet 0   aspirin-acetaminophen-caffeine (EXCEDRIN MIGRAINE) 250-250-65 MG tablet Take 1 tablet by mouth every 6 (six) hours as needed.     Cranberry (RA CRANBERRY) 500 MG CAPS Take by mouth.     famotidine  (PEPCID ) 20 MG tablet TAKE 1 TABLET BY MOUTH TWICE A DAY (Patient taking differently: Take 20 mg by mouth daily.) 180 tablet 3   meloxicam  (MOBIC ) 15 MG tablet Take 1 tablet (15 mg total) by mouth daily. 30 tablet 0   omeprazole  (PRILOSEC) 40 MG capsule Take 1 capsule (40 mg total) by mouth daily. (Patient taking differently: Take 40 mg by mouth daily as needed.) 30 capsule 3   rosuvastatin  (CRESTOR ) 5 MG tablet TAKE 1 TABLET (5 MG TOTAL) BY MOUTH DAILY. 30 tablet 5   No current facility-administered medications for this visit.    REVIEW OF SYSTEMS:   Constitutional: Denies fevers, chills or abnormal night sweats Breast:  Denies any palpable lumps or discharge All other systems were reviewed with the patient and are negative.  PHYSICAL EXAMINATION: ECOG PERFORMANCE  STATUS: 0 - Asymptomatic  Vitals:   07/20/24 1247  BP: 128/76  Pulse: 78  Resp: 18  Temp: 98.4 F (36.9 C)  SpO2: 98%   Filed Weights   07/20/24 1247  Weight: 205 lb (93 kg)    GENERAL:alert, no distress and comfortable  LABORATORY DATA:  I have reviewed the data as listed Lab Results  Component Value Date   WBC 7.0 11/25/2023   HGB 13.5 11/25/2023   HCT 40.7 11/25/2023  MCV 88.3 11/25/2023   PLT 212.0 11/25/2023   Lab Results  Component Value Date   NA 139 11/25/2023   K 5.0 11/25/2023   CL 102 11/25/2023   CO2 30 11/25/2023      ASSESSMENT AND PLAN:  At high risk for breast cancer 1.  Risk assessment: ASABRA Kubas model: 5-year risk: 2.6% (average risk 1.4%) B.  Tyrer-Cuzick model: 10-year risk: 7.4% (average use 2.9%) C.  Tyrer-Cuzick model: Lifetime risk 20.7% (average risk 8.6%)  2. Risk reduction: A.  Pharmacological risk reduction: Tamoxifen versus raloxifene: Did not recommend B.  Nonpharmacological risk reduction: Stress importance of eating healthy, diet, exercise, supplements like vitamin D and turmeric, avoiding alcohol  3.  Breast cancer surveillance: A.  Mammograms annually (July 2025: Benign breast density category B) B.  Breast MRIs annually for a few years and then every other year  Patient will call us  back to inform us  if she would like to do the MRI in December or in January.  Return to clinic in 1 year for follow-up with a telephone visit. If Dr. Carleton is able to order these MRIs that would be fine, if there is any issue we can continue to watch her annually.   All questions were answered. The patient knows to call the clinic with any problems, questions or concerns. I personally spent a total of 30 minutes in the care of the patient today including preparing to see the patient, getting/reviewing separately obtained history, performing a medically appropriate exam/evaluation, counseling and educating, placing orders, referring and  communicating with other health care professionals, documenting clinical information in the EHR, independently interpreting results, communicating results, and coordinating care.   Viinay K Kamora Vossler, MD 07/20/24

## 2024-07-20 NOTE — Assessment & Plan Note (Signed)
 1.  Risk assessment: ASABRA Kubas model: 5-year risk: 2.6% (average risk 1.4%) B.  Tyrer-Cuzick model: 10-year risk: 7.4% (average use 2.9%) C.  Tyrer-Cuzick model: Lifetime risk 20.7% (average risk 8.6%)  2. Risk reduction: A.  Pharmacological risk reduction: Tamoxifen versus raloxifene: Did not recommend B.  Nonpharmacological risk reduction: Stress importance of eating healthy, diet, exercise, supplements like vitamin D and turmeric, avoiding alcohol  3.  Breast cancer surveillance: A.  Mammograms annually (July 2025: Benign breast density category B) B.  Breast MRIs annually for a few years and then every other year  Patient will call us  back to inform us  if she would like to do the MRI in December or in January.  Return to clinic in 1 year for follow-up with a telephone visit. If Dr. Carleton is able to order these MRIs that would be fine, if there is any issue we can continue to watch her annually.

## 2024-08-13 ENCOUNTER — Ambulatory Visit: Admitting: Podiatry

## 2024-08-13 ENCOUNTER — Encounter: Payer: Self-pay | Admitting: Podiatry

## 2024-08-13 DIAGNOSIS — M722 Plantar fascial fibromatosis: Secondary | ICD-10-CM | POA: Diagnosis not present

## 2024-08-13 NOTE — Progress Notes (Signed)
°  Subjective:  Patient ID: Kristen Evans, female    DOB: Jun 12, 1971,   MRN: [id number]  Chief Complaint  Patient presents with   Plantar Fasciitis    It is better, I'm not pain free but it's better than what it was.    53 y.o. female presents for follow-up of right plantar fasciitis.  She relates that is doing better but not completely pain-free.  She does relate about 50 to 60% better.  She has been stretching and wearing supportive shoes.  She does relate the meloxicam  did help.. Denies any other pedal complaints. Denies n/v/f/c.   Past Medical History:  Diagnosis Date   Allergy    seasonal    Anemia    with pregnancy   Fibrocystic breast disease    GERD (gastroesophageal reflux disease)    pregnancy and recently   Left ankle pain 03/04/2017   Lipoma of lower extremity 11/21/2011   Migraine    Migraines 11/21/2011   Obesity    Preventative health care 11/21/2011   Reflux 11/21/2011    Objective:  Physical Exam: Vascular: DP/PT pulses 2/4 bilateral. CFT <3 seconds. Normal hair growth on digits. No edema.  Skin. No lacerations or abrasions bilateral feet.  Musculoskeletal: MMT 5/5 bilateral lower extremities in DF, PF, Inversion and Eversion. Deceased ROM in DF of ankle joint.  Mildly tender to the medial calcaneal tubercle left . No pain with achilles, PT or arch. No pain with calcaneal squeeze.  Neurological: Sensation intact to light touch.   Assessment:   1. Plantar fasciitis, left       Plan:  Patient was evaluated and treated and all questions answered. Discussed plantar fasciitis with patient.  X-rays reviewed and discussed with patient. No acute fractures or dislocations noted. Mild spurring noted at inferior calcaneus.  Discussed treatment options including, ice, NSAIDS, supportive shoes, bracing, and stretching.  Continue stretching and brace. Anti-inflammatories as needed. She is concerned about trip and April.  Discussed if continued trouble then would  consider a shot at that time and/or a Medrol  Dosepak. Follow-up prior to this trip if needed.   Asberry Failing, DPM

## 2024-08-19 ENCOUNTER — Encounter: Payer: Self-pay | Admitting: Family Medicine

## 2024-08-19 ENCOUNTER — Other Ambulatory Visit: Payer: Self-pay | Admitting: Family Medicine

## 2024-08-19 MED ORDER — ZEPBOUND 2.5 MG/0.5ML ~~LOC~~ SOAJ
2.5000 mg | SUBCUTANEOUS | 1 refills | Status: DC
  Filled 2024-08-19: qty 2, 28d supply, fill #0

## 2024-08-20 ENCOUNTER — Other Ambulatory Visit (HOSPITAL_BASED_OUTPATIENT_CLINIC_OR_DEPARTMENT_OTHER): Payer: Self-pay

## 2024-08-20 NOTE — Telephone Encounter (Signed)
 Called patient and no answer left message to return call.

## 2024-08-21 ENCOUNTER — Telehealth: Payer: Self-pay

## 2024-08-21 DIAGNOSIS — Z6833 Body mass index (BMI) 33.0-33.9, adult: Secondary | ICD-10-CM

## 2024-08-21 NOTE — Telephone Encounter (Unsigned)
 Copied from CRM #[id number]. Topic: Clinical - Prescription Issue >> Aug 21, 2024 10:42 AM Wess RAMAN wrote: Reason for CRM: Patient stated tirzepatide  (ZEPBOUND ) 2.5 MG/0.5ML Pen was $1400 and she will not be starting it  Callback #: [id number]

## 2024-08-21 NOTE — Telephone Encounter (Signed)
 Patient states that she could not afford $1300 a month for the Zepbound  and would like to know if the office has a program that could help with the cost. She reports that he insurance does not cover anything and she needs help with the cost. She was advised that we do have a pharmacist in the office and a message can be send to see if she could help the pt.

## 2024-08-23 ENCOUNTER — Other Ambulatory Visit: Payer: Self-pay | Admitting: Family Medicine

## 2024-08-23 DIAGNOSIS — Z6833 Body mass index (BMI) 33.0-33.9, adult: Secondary | ICD-10-CM

## 2024-08-24 NOTE — Telephone Encounter (Signed)
 Pt stated that she will go.  Referral has already been placed.

## 2024-09-01 ENCOUNTER — Telehealth: Payer: Self-pay

## 2024-09-01 NOTE — Progress Notes (Signed)
 Complex Care Management Note Care Guide Note  09/01/2024 Name: Kristen Evans MRN: [id number] DOB: July 10, 1971   Complex Care Management Outreach Attempts: An unsuccessful telephone outreach was attempted today to offer the patient information about available complex care management services.  Follow Up Plan:  Additional outreach attempts will be made to offer the patient complex care management information and services.   Encounter Outcome:  No Answer  Dreama [facility]  [facility], [facility] VBCI Assistant Direct Dial: [phone]  Fax: [phone]

## 2024-09-07 ENCOUNTER — Other Ambulatory Visit: Payer: Self-pay | Admitting: Family Medicine

## 2024-09-07 NOTE — Progress Notes (Unsigned)
 Complex Care Management Note Care Guide Note  09/07/2024 Name: Kristen Evans MRN: [id number] DOB: [birth year]   Complex Care Management Outreach Attempts: A second unsuccessful outreach was attempted today to offer the patient with information about available complex care management services.  Follow Up Plan:  Additional outreach attempts will be made to offer the patient complex care management information and services.   Encounter Outcome:  No Answer  Dreama Lynwood [facility]  [facility], [facility] VBCI Assistant Direct Dial: [phone]  Fax: [phone]

## 2024-09-09 NOTE — Progress Notes (Signed)
 Complex Care Management Care Guide Note  09/09/2024 Name: Kristen Evans MRN: [id number] DOB: 03-22-1971  [name] is a 54 y.o. year old female who is a primary care patient of Domenica Harlene LABOR, MD and is actively engaged with the care management team. [name] called by phone today to assist with scheduling  with the Pharmacist, but states she has already been outreached by Madelin Ray, RPH.  Follow up plan: Will close request at this time per patient request.   Dreama Lynwood [facility]  [facility], [facility] VBCI Assistant Direct Dial: [phone]  Fax: [phone]

## 2024-09-10 ENCOUNTER — Telehealth: Payer: Self-pay | Admitting: Pharmacist

## 2024-09-10 MED ORDER — TIRZEPATIDE-WEIGHT MANAGEMENT 2.5 MG/0.5ML ~~LOC~~ SOLN: 2.5000 mg | SUBCUTANEOUS | 0 refills | Status: DC

## 2024-09-10 NOTE — Telephone Encounter (Signed)
 Patient was prescribed Zepbound  by Dr Domenica in December. Rx was sent to [facility] pharmacy but patient's insurance did not cover and cost was too high. Discussed self patient option thru [employer]. Patient has decided she would like to start this option. Rx resent for vials of Zepbound  2.5mg  weekly. Sent to [employer] pay option.

## 2024-10-05 ENCOUNTER — Encounter: Payer: Self-pay | Admitting: Family Medicine

## 2024-10-06 ENCOUNTER — Telehealth: Payer: Self-pay | Admitting: Pharmacist

## 2024-10-06 ENCOUNTER — Other Ambulatory Visit: Payer: Self-pay | Admitting: Family Medicine

## 2024-10-06 DIAGNOSIS — F419 Anxiety disorder, unspecified: Secondary | ICD-10-CM

## 2024-10-06 MED ORDER — TIRZEPATIDE-WEIGHT MANAGEMENT 2.5 MG/0.5ML ~~LOC~~ SOLN: 2.5000 mg | SUBCUTANEOUS | 0 refills | Status: AC

## 2024-10-07 NOTE — Telephone Encounter (Signed)
 Requesting: alprazolam  0.25mg   Contract: None UDS: None Last Visit: 06/25/24 Next Visit:12/21/24 w/ Dr. Dameron  Last Refill: 11/19/22 #10 and 0RF   Please Advise

## 2024-12-14 ENCOUNTER — Ambulatory Visit: Admitting: Podiatry

## 2024-12-17 ENCOUNTER — Encounter: Admitting: Family Medicine

## 2024-12-21 ENCOUNTER — Encounter: Admitting: Student

## 2025-07-20 ENCOUNTER — Inpatient Hospital Stay: Admitting: Hematology and Oncology
